# Patient Record
Sex: Female | Born: 1985 | Race: White | Hispanic: No | Marital: Married | Smoking: Never smoker
Health system: Southern US, Community
[De-identification: ages and names within clinical notes are randomized; demographics above are authoritative.]

## PROBLEM LIST (undated history)

## (undated) DIAGNOSIS — G43909 Migraine, unspecified, not intractable, without status migrainosus: Secondary | ICD-10-CM

## (undated) DIAGNOSIS — I471 Supraventricular tachycardia, unspecified: Secondary | ICD-10-CM

## (undated) DIAGNOSIS — N200 Calculus of kidney: Secondary | ICD-10-CM

## (undated) DIAGNOSIS — M5020 Other cervical disc displacement, unspecified cervical region: Secondary | ICD-10-CM

## (undated) DIAGNOSIS — D134 Benign neoplasm of liver: Secondary | ICD-10-CM

## (undated) DIAGNOSIS — Z87442 Personal history of urinary calculi: Secondary | ICD-10-CM

## (undated) DIAGNOSIS — M5030 Other cervical disc degeneration, unspecified cervical region: Secondary | ICD-10-CM

## (undated) HISTORY — DX: Other cervical disc degeneration, unspecified cervical region: M50.30

## (undated) HISTORY — PX: OTHER SURGICAL HISTORY: SHX169

## (undated) HISTORY — DX: Calculus of kidney: N20.0

## (undated) HISTORY — DX: Other cervical disc displacement, unspecified cervical region: M50.20

## (undated) HISTORY — PX: ADENOIDECTOMY: SUR15

---
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
## 2003-11-16 ENCOUNTER — Other Ambulatory Visit: Admission: RE | Admit: 2003-11-16 | Discharge: 2003-11-16 | Payer: Self-pay | Admitting: Obstetrics and Gynecology

## 2005-11-08 ENCOUNTER — Encounter: Admission: RE | Admit: 2005-11-08 | Discharge: 2005-11-08 | Payer: Self-pay | Admitting: Orthopedic Surgery

## 2005-11-09 HISTORY — PX: FOOT SURGERY: SHX648

## 2007-03-10 ENCOUNTER — Encounter: Admission: RE | Admit: 2007-03-10 | Discharge: 2007-03-10 | Payer: Self-pay | Admitting: Neurology

## 2010-11-09 DIAGNOSIS — I471 Supraventricular tachycardia, unspecified: Secondary | ICD-10-CM

## 2010-11-09 HISTORY — DX: Supraventricular tachycardia, unspecified: I47.10

## 2010-11-21 ENCOUNTER — Ambulatory Visit (HOSPITAL_COMMUNITY)
Admission: RE | Admit: 2010-11-21 | Discharge: 2010-11-21 | Payer: Self-pay | Source: Home / Self Care | Attending: Internal Medicine | Admitting: Internal Medicine

## 2011-04-29 ENCOUNTER — Other Ambulatory Visit (HOSPITAL_COMMUNITY): Payer: Self-pay | Admitting: Internal Medicine

## 2011-04-29 DIAGNOSIS — R109 Unspecified abdominal pain: Secondary | ICD-10-CM

## 2011-04-30 ENCOUNTER — Other Ambulatory Visit (HOSPITAL_COMMUNITY): Payer: Self-pay

## 2011-05-01 ENCOUNTER — Other Ambulatory Visit (HOSPITAL_COMMUNITY): Payer: Self-pay

## 2011-05-05 ENCOUNTER — Other Ambulatory Visit (HOSPITAL_COMMUNITY): Payer: Self-pay

## 2011-05-18 ENCOUNTER — Other Ambulatory Visit (HOSPITAL_COMMUNITY): Payer: Self-pay

## 2012-02-29 DIAGNOSIS — L259 Unspecified contact dermatitis, unspecified cause: Secondary | ICD-10-CM | POA: Insufficient documentation

## 2012-03-21 DIAGNOSIS — Z0000 Encounter for general adult medical examination without abnormal findings: Secondary | ICD-10-CM | POA: Insufficient documentation

## 2012-05-10 DIAGNOSIS — R519 Headache, unspecified: Secondary | ICD-10-CM | POA: Insufficient documentation

## 2012-05-10 DIAGNOSIS — IMO0001 Reserved for inherently not codable concepts without codable children: Secondary | ICD-10-CM | POA: Insufficient documentation

## 2012-05-10 DIAGNOSIS — R51 Headache: Secondary | ICD-10-CM

## 2012-11-10 ENCOUNTER — Telehealth: Payer: Self-pay

## 2012-11-10 NOTE — Telephone Encounter (Signed)
PT STATES SHE HAD A TETANUS SHOT IN 08 AND NEED TO COME BY AND P/U A COPY. WOULD LIKE TO HAVE BY TOMORROW IF POSSIBLE. PLEASE CALL [phone]

## 2012-11-10 NOTE — Telephone Encounter (Signed)
Abstracted, printed placed at front desk. Called patient to advise.

## 2015-11-10 NOTE — L&D Delivery Note (Addendum)
Pt complete and at +2 station with epidural controlling pain. Pt pushed for about 30 mins to deliver a viable female infant in OA position over first degree perineal laceration. Face was noted to be flush with perineum on delivery of head. Bed was flattened and McRoberts employed.  Anterior shoulder was then delivered with third set of pushes after posterior shoulder was rotated anteriorly. Next push after that was effective in also delivering posterior shoulder; body easily followed next. Umbilical cord was noted to be around her foot x 1.  Infant was placed on mothers abdomen and effectively stimulated eliciting a vigorous cry. Cord was then clamped and cut. Cord blood was obtained.  Placenta then delivered about 5 mins later intact, 3VC shultz. Fundal massage performed and pitocin per protocol. Fundus firm. Vaginal inspection confirmed first degree perineal lac with a periurethral abrasion as well. These were repaired with 2-0 vicryl suture.Mother and baby stable. Counts correct. Apgars of 7 and 9

## 2015-11-20 DIAGNOSIS — Z36 Encounter for antenatal screening of mother: Secondary | ICD-10-CM | POA: Diagnosis not present

## 2015-11-20 DIAGNOSIS — Z3A27 27 weeks gestation of pregnancy: Secondary | ICD-10-CM | POA: Diagnosis not present

## 2015-11-20 DIAGNOSIS — Z23 Encounter for immunization: Secondary | ICD-10-CM | POA: Diagnosis not present

## 2015-11-25 DIAGNOSIS — O99810 Abnormal glucose complicating pregnancy: Secondary | ICD-10-CM | POA: Diagnosis not present

## 2015-12-31 DIAGNOSIS — O368130 Decreased fetal movements, third trimester, not applicable or unspecified: Secondary | ICD-10-CM | POA: Diagnosis not present

## 2015-12-31 DIAGNOSIS — Z3A33 33 weeks gestation of pregnancy: Secondary | ICD-10-CM | POA: Diagnosis not present

## 2016-01-03 DIAGNOSIS — O368130 Decreased fetal movements, third trimester, not applicable or unspecified: Secondary | ICD-10-CM | POA: Diagnosis not present

## 2016-01-03 DIAGNOSIS — Z3A33 33 weeks gestation of pregnancy: Secondary | ICD-10-CM | POA: Diagnosis not present

## 2016-01-03 LAB — OB RESULTS CONSOLE ANTIBODY SCREEN: ANTIBODY SCREEN: NEGATIVE

## 2016-01-03 LAB — OB RESULTS CONSOLE GC/CHLAMYDIA
Chlamydia: NEGATIVE
GC PROBE AMP, GENITAL: NEGATIVE

## 2016-01-03 LAB — OB RESULTS CONSOLE RPR: RPR: NONREACTIVE

## 2016-01-03 LAB — OB RESULTS CONSOLE HEPATITIS B SURFACE ANTIGEN: Hepatitis B Surface Ag: NEGATIVE

## 2016-01-03 LAB — OB RESULTS CONSOLE RUBELLA ANTIBODY, IGM: Rubella: IMMUNE

## 2016-01-03 LAB — OB RESULTS CONSOLE ABO/RH: RH TYPE: POSITIVE

## 2016-01-03 LAB — OB RESULTS CONSOLE HIV ANTIBODY (ROUTINE TESTING): HIV: NONREACTIVE

## 2016-01-13 DIAGNOSIS — Z7689 Persons encountering health services in other specified circumstances: Secondary | ICD-10-CM | POA: Diagnosis not present

## 2016-01-13 DIAGNOSIS — F411 Generalized anxiety disorder: Secondary | ICD-10-CM | POA: Diagnosis not present

## 2016-01-15 DIAGNOSIS — Z3403 Encounter for supervision of normal first pregnancy, third trimester: Secondary | ICD-10-CM | POA: Diagnosis not present

## 2016-01-15 DIAGNOSIS — Z36 Encounter for antenatal screening of mother: Secondary | ICD-10-CM | POA: Diagnosis not present

## 2016-01-15 DIAGNOSIS — Z3A35 35 weeks gestation of pregnancy: Secondary | ICD-10-CM | POA: Diagnosis not present

## 2016-01-17 LAB — OB RESULTS CONSOLE GBS: STREP GROUP B AG: POSITIVE

## 2016-01-21 DIAGNOSIS — Z3403 Encounter for supervision of normal first pregnancy, third trimester: Secondary | ICD-10-CM | POA: Diagnosis not present

## 2016-01-21 DIAGNOSIS — Z3A36 36 weeks gestation of pregnancy: Secondary | ICD-10-CM | POA: Diagnosis not present

## 2016-01-28 DIAGNOSIS — Z3A37 37 weeks gestation of pregnancy: Secondary | ICD-10-CM | POA: Diagnosis not present

## 2016-01-28 DIAGNOSIS — Z3403 Encounter for supervision of normal first pregnancy, third trimester: Secondary | ICD-10-CM | POA: Diagnosis not present

## 2016-02-03 DIAGNOSIS — Z3403 Encounter for supervision of normal first pregnancy, third trimester: Secondary | ICD-10-CM | POA: Diagnosis not present

## 2016-02-03 DIAGNOSIS — Z3A37 37 weeks gestation of pregnancy: Secondary | ICD-10-CM | POA: Diagnosis not present

## 2016-02-11 DIAGNOSIS — Z3A39 39 weeks gestation of pregnancy: Secondary | ICD-10-CM | POA: Diagnosis not present

## 2016-02-11 DIAGNOSIS — Z3403 Encounter for supervision of normal first pregnancy, third trimester: Secondary | ICD-10-CM | POA: Diagnosis not present

## 2016-02-14 DIAGNOSIS — Z3403 Encounter for supervision of normal first pregnancy, third trimester: Secondary | ICD-10-CM | POA: Diagnosis not present

## 2016-02-14 DIAGNOSIS — O480 Post-term pregnancy: Secondary | ICD-10-CM | POA: Diagnosis not present

## 2016-02-14 DIAGNOSIS — Z3A39 39 weeks gestation of pregnancy: Secondary | ICD-10-CM | POA: Diagnosis not present

## 2016-02-15 ENCOUNTER — Encounter (HOSPITAL_COMMUNITY): Payer: Self-pay

## 2016-02-15 ENCOUNTER — Inpatient Hospital Stay (HOSPITAL_COMMUNITY)
Admission: AD | Admit: 2016-02-15 | Discharge: 2016-02-18 | DRG: 775 | Disposition: A | Discharge status: Home / Self Care | Payer: 59 | Source: Ambulatory Visit | Attending: Obstetrics and Gynecology | Admitting: Obstetrics and Gynecology

## 2016-02-15 ENCOUNTER — Inpatient Hospital Stay (HOSPITAL_COMMUNITY)
Admission: AD | Admit: 2016-02-15 | Discharge: 2016-02-15 | Disposition: A | Discharge status: Home / Self Care | Payer: 59 | Source: Ambulatory Visit | Attending: Obstetrics and Gynecology | Admitting: Obstetrics and Gynecology

## 2016-02-15 DIAGNOSIS — O24420 Gestational diabetes mellitus in childbirth, diet controlled: Secondary | ICD-10-CM | POA: Diagnosis not present

## 2016-02-15 DIAGNOSIS — Z3A39 39 weeks gestation of pregnancy: Secondary | ICD-10-CM

## 2016-02-15 DIAGNOSIS — O26893 Other specified pregnancy related conditions, third trimester: Secondary | ICD-10-CM

## 2016-02-15 DIAGNOSIS — K219 Gastro-esophageal reflux disease without esophagitis: Secondary | ICD-10-CM | POA: Diagnosis present

## 2016-02-15 DIAGNOSIS — O99824 Streptococcus B carrier state complicating childbirth: Secondary | ICD-10-CM | POA: Diagnosis not present

## 2016-02-15 DIAGNOSIS — O9962 Diseases of the digestive system complicating childbirth: Secondary | ICD-10-CM | POA: Diagnosis present

## 2016-02-15 DIAGNOSIS — Z8249 Family history of ischemic heart disease and other diseases of the circulatory system: Secondary | ICD-10-CM

## 2016-02-15 DIAGNOSIS — IMO0001 Reserved for inherently not codable concepts without codable children: Secondary | ICD-10-CM

## 2016-02-15 HISTORY — DX: Supraventricular tachycardia: I47.1

## 2016-02-15 HISTORY — DX: Supraventricular tachycardia, unspecified: I47.10

## 2016-02-15 NOTE — MAU Note (Signed)
Pt complaints of contractions increasing in pain and frequency overnight. Pt denies bleeding and leaking of fluid. Pt states baby is moving normally.

## 2016-02-15 NOTE — MAU Note (Signed)
Contractions since yesterday. Stronger and closer tonight. Denies LOF or bleeding

## 2016-02-16 ENCOUNTER — Inpatient Hospital Stay (HOSPITAL_COMMUNITY): Payer: 59 | Admitting: Anesthesiology

## 2016-02-16 ENCOUNTER — Encounter (HOSPITAL_COMMUNITY): Payer: Self-pay | Admitting: *Deleted

## 2016-02-16 DIAGNOSIS — O9962 Diseases of the digestive system complicating childbirth: Secondary | ICD-10-CM | POA: Diagnosis present

## 2016-02-16 DIAGNOSIS — Z8249 Family history of ischemic heart disease and other diseases of the circulatory system: Secondary | ICD-10-CM | POA: Diagnosis not present

## 2016-02-16 DIAGNOSIS — K219 Gastro-esophageal reflux disease without esophagitis: Secondary | ICD-10-CM | POA: Diagnosis present

## 2016-02-16 DIAGNOSIS — Z3A39 39 weeks gestation of pregnancy: Secondary | ICD-10-CM | POA: Diagnosis not present

## 2016-02-16 DIAGNOSIS — IMO0001 Reserved for inherently not codable concepts without codable children: Secondary | ICD-10-CM

## 2016-02-16 DIAGNOSIS — O24420 Gestational diabetes mellitus in childbirth, diet controlled: Secondary | ICD-10-CM | POA: Diagnosis present

## 2016-02-16 DIAGNOSIS — O99824 Streptococcus B carrier state complicating childbirth: Secondary | ICD-10-CM | POA: Diagnosis present

## 2016-02-16 LAB — TYPE AND SCREEN
ABO/RH(D): A POS
Antibody Screen: NEGATIVE

## 2016-02-16 LAB — CBC
HCT: 38.1 % (ref 36.0–46.0)
Hemoglobin: 13.5 g/dL (ref 12.0–15.0)
MCH: 32.5 pg (ref 26.0–34.0)
MCHC: 35.4 g/dL (ref 30.0–36.0)
MCV: 91.8 fL (ref 78.0–100.0)
Platelets: 224 10*3/uL (ref 150–400)
RBC: 4.15 MIL/uL (ref 3.87–5.11)
RDW: 13.1 % (ref 11.5–15.5)
WBC: 16.5 10*3/uL — ABNORMAL HIGH (ref 4.0–10.5)

## 2016-02-16 LAB — ABO/RH: ABO/RH(D): A POS

## 2016-02-16 LAB — RPR: RPR: NONREACTIVE

## 2016-02-16 MED ORDER — EPHEDRINE 5 MG/ML INJ
10.0000 mg | INTRAVENOUS | Status: DC | PRN
  Filled 2016-02-16: qty 2

## 2016-02-16 MED ORDER — ACETAMINOPHEN 325 MG PO TABS
650.0000 mg | ORAL_TABLET | ORAL | Status: DC | PRN
  Administered 2016-02-16: 650 mg via ORAL
  Filled 2016-02-16: qty 2

## 2016-02-16 MED ORDER — BENZOCAINE-MENTHOL 20-0.5 % EX AERO
1.0000 "application " | INHALATION_SPRAY | CUTANEOUS | Status: DC | PRN
  Administered 2016-02-16: 1 via TOPICAL
  Filled 2016-02-16: qty 56

## 2016-02-16 MED ORDER — PENICILLIN G POTASSIUM 5000000 UNITS IJ SOLR
2.5000 10*6.[IU] | INTRAVENOUS | Status: DC
  Administered 2016-02-16 (×2): 2.5 10*6.[IU] via INTRAVENOUS
  Filled 2016-02-16 (×6): qty 2.5

## 2016-02-16 MED ORDER — LIDOCAINE HCL (PF) 1 % IJ SOLN
30.0000 mL | INTRAMUSCULAR | Status: DC | PRN
  Filled 2016-02-16: qty 30

## 2016-02-16 MED ORDER — PHENYLEPHRINE 40 MCG/ML (10ML) SYRINGE FOR IV PUSH (FOR BLOOD PRESSURE SUPPORT)
80.0000 ug | PREFILLED_SYRINGE | INTRAVENOUS | Status: DC | PRN
  Filled 2016-02-16: qty 2
  Filled 2016-02-16: qty 20

## 2016-02-16 MED ORDER — LACTATED RINGERS IV SOLN
INTRAVENOUS | Status: DC
  Administered 2016-02-16 (×2): via INTRAVENOUS

## 2016-02-16 MED ORDER — LACTATED RINGERS IV SOLN: INTRAVENOUS | Status: DC

## 2016-02-16 MED ORDER — OXYCODONE-ACETAMINOPHEN 5-325 MG PO TABS: 2.0000 | ORAL_TABLET | ORAL | Status: DC | PRN

## 2016-02-16 MED ORDER — FENTANYL 2.5 MCG/ML BUPIVACAINE 1/10 % EPIDURAL INFUSION (WH - ANES)
14.0000 mL/h | INTRAMUSCULAR | Status: DC | PRN
  Administered 2016-02-16 (×3): 14 mL/h via EPIDURAL
  Filled 2016-02-16 (×3): qty 125

## 2016-02-16 MED ORDER — ACETAMINOPHEN 325 MG PO TABS
650.0000 mg | ORAL_TABLET | Freq: Once | ORAL | Status: AC
  Administered 2016-02-16: 650 mg via ORAL
  Filled 2016-02-16: qty 2

## 2016-02-16 MED ORDER — LANOLIN HYDROUS EX OINT: TOPICAL_OINTMENT | CUTANEOUS | Status: DC | PRN

## 2016-02-16 MED ORDER — DIPHENHYDRAMINE HCL 50 MG/ML IJ SOLN: 12.5000 mg | INTRAMUSCULAR | Status: DC | PRN

## 2016-02-16 MED ORDER — IBUPROFEN 600 MG PO TABS
600.0000 mg | ORAL_TABLET | Freq: Four times a day (QID) | ORAL | Status: DC
  Administered 2016-02-16 – 2016-02-18 (×7): 600 mg via ORAL
  Filled 2016-02-16 (×7): qty 1

## 2016-02-16 MED ORDER — ONDANSETRON HCL 4 MG PO TABS: 4.0000 mg | ORAL_TABLET | ORAL | Status: DC | PRN

## 2016-02-16 MED ORDER — PHENYLEPHRINE 40 MCG/ML (10ML) SYRINGE FOR IV PUSH (FOR BLOOD PRESSURE SUPPORT)
80.0000 ug | PREFILLED_SYRINGE | INTRAVENOUS | Status: DC | PRN
  Filled 2016-02-16: qty 2

## 2016-02-16 MED ORDER — PRENATAL MULTIVITAMIN CH
1.0000 | ORAL_TABLET | Freq: Every day | ORAL | Status: DC
  Administered 2016-02-17 – 2016-02-18 (×2): 1 via ORAL
  Filled 2016-02-16 (×2): qty 1

## 2016-02-16 MED ORDER — SIMETHICONE 80 MG PO CHEW: 80.0000 mg | CHEWABLE_TABLET | ORAL | Status: DC | PRN

## 2016-02-16 MED ORDER — WITCH HAZEL-GLYCERIN EX PADS: 1.0000 "application " | MEDICATED_PAD | CUTANEOUS | Status: DC | PRN

## 2016-02-16 MED ORDER — OXYTOCIN BOLUS FROM INFUSION: 500.0000 mL | INTRAVENOUS | Status: DC

## 2016-02-16 MED ORDER — OXYTOCIN 10 UNIT/ML IJ SOLN
1.0000 m[IU]/min | INTRAVENOUS | Status: DC
  Administered 2016-02-16: 2 m[IU]/min via INTRAVENOUS

## 2016-02-16 MED ORDER — DIPHENHYDRAMINE HCL 25 MG PO CAPS: 25.0000 mg | ORAL_CAPSULE | Freq: Four times a day (QID) | ORAL | Status: DC | PRN

## 2016-02-16 MED ORDER — LACTATED RINGERS IV SOLN
500.0000 mL | Freq: Once | INTRAVENOUS | Status: AC
  Administered 2016-02-16: 1000 mL via INTRAVENOUS

## 2016-02-16 MED ORDER — SENNOSIDES-DOCUSATE SODIUM 8.6-50 MG PO TABS
2.0000 | ORAL_TABLET | ORAL | Status: DC
  Administered 2016-02-16 – 2016-02-18 (×2): 2 via ORAL
  Filled 2016-02-16 (×2): qty 2

## 2016-02-16 MED ORDER — TETANUS-DIPHTH-ACELL PERTUSSIS 5-2.5-18.5 LF-MCG/0.5 IM SUSP: 0.5000 mL | Freq: Once | INTRAMUSCULAR | Status: DC

## 2016-02-16 MED ORDER — ACETAMINOPHEN 325 MG PO TABS
650.0000 mg | ORAL_TABLET | ORAL | Status: DC | PRN
  Administered 2016-02-17 (×2): 650 mg via ORAL
  Filled 2016-02-16 (×2): qty 2

## 2016-02-16 MED ORDER — OXYCODONE-ACETAMINOPHEN 5-325 MG PO TABS: 1.0000 | ORAL_TABLET | ORAL | Status: DC | PRN

## 2016-02-16 MED ORDER — LACTATED RINGERS IV SOLN
2.5000 [IU]/h | INTRAVENOUS | Status: DC
  Filled 2016-02-16: qty 4

## 2016-02-16 MED ORDER — BUTORPHANOL TARTRATE 1 MG/ML IJ SOLN
INTRAMUSCULAR | Status: AC
  Filled 2016-02-16: qty 2

## 2016-02-16 MED ORDER — LACTATED RINGERS IV SOLN
500.0000 mL | Freq: Once | INTRAVENOUS | Status: DC
Start: 2016-02-16 — End: 2016-02-16

## 2016-02-16 MED ORDER — BUTORPHANOL TARTRATE 2 MG/ML IJ SOLN: 2.0000 mg | Freq: Once | INTRAMUSCULAR | Status: DC

## 2016-02-16 MED ORDER — TERBUTALINE SULFATE 1 MG/ML IJ SOLN
0.2500 mg | Freq: Once | INTRAMUSCULAR | Status: DC | PRN
  Filled 2016-02-16: qty 1

## 2016-02-16 MED ORDER — PENICILLIN G POTASSIUM 5000000 UNITS IJ SOLR
5.0000 10*6.[IU] | Freq: Once | INTRAVENOUS | Status: AC
  Administered 2016-02-16: 5 10*6.[IU] via INTRAVENOUS
  Filled 2016-02-16: qty 5

## 2016-02-16 MED ORDER — ONDANSETRON HCL 4 MG/2ML IJ SOLN
4.0000 mg | Freq: Four times a day (QID) | INTRAMUSCULAR | Status: DC | PRN
  Administered 2016-02-16 (×2): 4 mg via INTRAVENOUS
  Filled 2016-02-16 (×2): qty 2

## 2016-02-16 MED ORDER — ZOLPIDEM TARTRATE 5 MG PO TABS: 5.0000 mg | ORAL_TABLET | Freq: Every evening | ORAL | Status: DC | PRN

## 2016-02-16 MED ORDER — DIBUCAINE 1 % RE OINT: 1.0000 "application " | TOPICAL_OINTMENT | RECTAL | Status: DC | PRN

## 2016-02-16 MED ORDER — CITRIC ACID-SODIUM CITRATE 334-500 MG/5ML PO SOLN
30.0000 mL | ORAL | Status: DC | PRN
  Administered 2016-02-16: 30 mL via ORAL
  Filled 2016-02-16: qty 15

## 2016-02-16 MED ORDER — FLEET ENEMA 7-19 GM/118ML RE ENEM: 1.0000 | ENEMA | RECTAL | Status: DC | PRN

## 2016-02-16 MED ORDER — LIDOCAINE HCL (PF) 1 % IJ SOLN
INTRAMUSCULAR | Status: DC | PRN
  Administered 2016-02-16: 5 mL
  Administered 2016-02-16: 3 mL
  Administered 2016-02-16: 2 mL via EPIDURAL

## 2016-02-16 MED ORDER — ONDANSETRON HCL 4 MG/2ML IJ SOLN: 4.0000 mg | INTRAMUSCULAR | Status: DC | PRN

## 2016-02-16 MED ORDER — LACTATED RINGERS IV SOLN: 500.0000 mL | INTRAVENOUS | Status: DC | PRN

## 2016-02-16 NOTE — Anesthesia Procedure Notes (Signed)
Epidural Patient location during procedure: OB  Staffing Anesthesiologist: Suzette Battiest Performed by: anesthesiologist   Preanesthetic Checklist Completed: patient identified, site marked, surgical consent, pre-op evaluation, timeout performed, IV checked, risks and benefits discussed and monitors and equipment checked  Epidural Patient position: sitting Prep: site prepped and draped and DuraPrep Patient monitoring: continuous pulse ox and blood pressure Approach: midline Location: L3-L4 Injection technique: LOR saline  Needle:  Needle type: Tuohy  Needle gauge: 17 G Needle length: 9 cm and 9 Needle insertion depth: 5 cm cm Catheter type: closed end flexible Catheter size: 19 Gauge Catheter at skin depth: 11 (10cm at skin initially. Advanced to 11cm when pt laid in right lat decubitus position.) cm Test dose: negative  Assessment Events: blood not aspirated, injection not painful, no injection resistance, negative IV test and no paresthesia

## 2016-02-16 NOTE — Progress Notes (Signed)
Patient ID: Lindsay Parsons, female   DOB: [birth year], 30 y.o.   MRN: [id number] Pt doing well. Comfortable with epidural. Not appreciating pressure or contractions. +Fms VSS  SVE 9.5/100/+1  A/P: progressing well in labor on pitocin         Recheck in an hour and will start pushing if complete          Anticipate svd

## 2016-02-16 NOTE — Progress Notes (Signed)
Patient ID: Lindsay Parsons, female   DOB: 1986-02-20, 30 y.o.   MRN: [id number] Pt comfortable with epidural. Has no complaints. +Fms VSS EFM- 150s, +accels, - decels, moderate variability, cat 1 TOCO-contractions q 81mins SVE- 6/90/-1  A/P: G1P0 at 39+ weeks GDMA1, labor         Will begin pitocin augmentation         GBS prophylaxis ongoing          Anticipate svd

## 2016-02-16 NOTE — Anesthesia Preprocedure Evaluation (Addendum)
Anesthesia Evaluation  Patient identified by MRN, date of birth, ID band Patient awake    Reviewed: Allergy & Precautions, Patient's Chart, lab work & pertinent test results  Airway Mallampati: II       Dental   Pulmonary neg pulmonary ROS,    Pulmonary exam normal        Cardiovascular negative cardio ROS Normal cardiovascular exam  Hx of SVT. Asymptomatic currently   Neuro/Psych negative neurological ROS     GI/Hepatic Neg liver ROS, GERD  ,  Endo/Other  negative endocrine ROS  Renal/GU negative Renal ROS     Musculoskeletal   Abdominal   Peds  Hematology negative hematology ROS (+)   Anesthesia Other Findings   Reproductive/Obstetrics (+) Pregnancy 30yo G1 @ 39.5WGA in labor                            Lab Results  Component Value Date   WBC 16.5* 02/16/2016   HGB 13.5 02/16/2016   HCT 38.1 02/16/2016   MCV 91.8 02/16/2016   PLT 224 02/16/2016   No results found for: CREATININE, BUN, NA, K, CL, CO2  Anesthesia Physical Anesthesia Plan  ASA: II  Anesthesia Plan: Epidural   Post-op Pain Management:    Induction:   Airway Management Planned: Natural Airway  Additional Equipment:   Intra-op Plan:   Post-operative Plan:   Informed Consent: I have reviewed the patients History and Physical, chart, labs and discussed the procedure including the risks, benefits and alternatives for the proposed anesthesia with the patient or authorized representative who has indicated his/her understanding and acceptance.     Plan Discussed with:   Anesthesia Plan Comments:         Anesthesia Quick Evaluation

## 2016-02-16 NOTE — H&P (Signed)
Lindsay Parsons is a 30 y.o. female presenting for painful regular contractions. Pt has been having regular contractions for the past 2-3days but no cervical change had been made. On arrival at MAU pt was confirmed to have changed to 3cm; intact membranes.  Pt had a prenatal course that was complicated only with diet controlled GDM. Her dating is based on lmp and confirmed with first trimester [place] at [facility].   Maternal Medical History:  Reason for admission: Contractions and nausea.   Contractions: Onset was 2 days ago.   Frequency: regular.   Perceived severity is moderate.    Fetal activity: Perceived fetal activity is normal.   Last perceived fetal movement was within the past hour.    Prenatal complications: no prenatal complications Prenatal Complications - Diabetes: gestational. Diabetes is managed by diet.      OB History    Gravida Para Term Preterm AB TAB SAB Ectopic Multiple Living   1         0     Past Medical History  Diagnosis Date  . SVT (supraventricular tachycardia) [facility])    Past Surgical History  Procedure Laterality Date  . Foot surgery  2007    cyst removed from left foot  . Tonsillectomy  1989   Family History: family history includes Cancer in her father, maternal grandfather, maternal grandmother, and paternal grandfather; Hyperlipidemia in her father; Hypertension in her paternal grandfather. Social History:  reports that she has never smoked. She has never used smokeless tobacco. She reports that she does not drink alcohol or use illicit drugs.   Prenatal Transfer Tool  Maternal Diabetes: Yes:  Diabetes Type:  Diet controlled Genetic Screening: Normal Maternal Ultrasounds/Referrals: Normal Fetal Ultrasounds or other Referrals:  None Maternal Substance Abuse:  No Significant Maternal Medications:  None Significant Maternal Lab Results:  Lab values include: Group B Strep positive Other Comments:  None  Review of Systems  Constitutional: Negative  for fever, chills, weight loss and malaise/fatigue.  Eyes: Negative for blurred vision.  Respiratory: Negative for shortness of breath.   Cardiovascular: Negative for chest pain.  Gastrointestinal: Positive for nausea. Negative for heartburn and vomiting.  Genitourinary: Negative for dysuria.  Musculoskeletal: Negative for back pain.  Neurological: Negative for dizziness and headaches.  Psychiatric/Behavioral: Negative for depression. The patient is not nervous/anxious.     Dilation: 5 Effacement (%): 90 Station: -1 Exam by:: [email or web address] Blood pressure 115/69, pulse 96, temperature 98.7 F (37.1 C), temperature source Oral, resp. rate 16, height 5\' 3"  (1.6 m), weight 196 lb (88.905 kg), SpO2 99 %. Maternal Exam:  Uterine Assessment: Contraction strength is moderate.  Contraction frequency is regular.   Abdomen: Patient reports generalized tenderness.  Estimated fetal weight is AGA.   Fetal presentation: vertex  Introitus: Normal vulva. Normal vagina.  Pelvis: adequate for delivery.   Cervix: Cervix evaluated by digital exam.     Physical Exam  Constitutional: She is oriented to person, place, and time. She appears well-developed and well-nourished.  Neck: Normal range of motion.  Cardiovascular: Normal rate.   Respiratory: Effort normal.  GI: Soft. There is generalized tenderness.  Genitourinary: Vagina normal and uterus normal.  Musculoskeletal: Normal range of motion.  Neurological: She is alert and oriented to person, place, and time.  Skin: Skin is warm.  Psychiatric: She has a normal mood and affect. Her behavior is normal. Judgment and thought content normal.    Prenatal labs: ABO, Rh: --/--/A POS, A POS (04/09 0230) Antibody: NEG (04/09  0230) Rubella: Immune (02/24 0000) RPR: Nonreactive (02/24 0000)  HBsAg: Negative (02/24 0000)  HIV: Non-reactive (02/24 0000)  GBS: Positive (03/10 0000)   Assessment/Plan: G1P0 at 75 5/[redacted]wks gestation in active labor  SVE now  5/80/0; appears ruptured Comfortable with epidural GBS prophylaxis begun If indicated will augment with pitocin Anticipate svd   Bonnee Quin Banga 02/16/2016, 7:29 AM

## 2016-02-17 LAB — CBC
HEMATOCRIT: 29.7 % — AB (ref 36.0–46.0)
HEMOGLOBIN: 10.4 g/dL — AB (ref 12.0–15.0)
MCH: 32.3 pg (ref 26.0–34.0)
MCHC: 35 g/dL (ref 30.0–36.0)
MCV: 92.2 fL (ref 78.0–100.0)
Platelets: 180 10*3/uL (ref 150–400)
RBC: 3.22 MIL/uL — AB (ref 3.87–5.11)
RDW: 13.1 % (ref 11.5–15.5)
WBC: 17.1 10*3/uL — AB (ref 4.0–10.5)

## 2016-02-17 NOTE — Progress Notes (Signed)
Post Partum Day 1 Subjective: up ad lib, voiding, tolerating PO, + flatus and breastfeeding well. Tired due to cluster feeding overnight Also reports back pain and feeling sore in vaginal area. Lochia mild. No HA or CP or SOB  Objective: Blood pressure 99/53, pulse 78, temperature 98 F (36.7 C), temperature source Oral, resp. rate 18, height 5\' 3"  (1.6 m), weight 196 lb (88.905 kg), SpO2 98 %, unknown if currently breastfeeding.  Physical Exam:  General: alert, cooperative, fatigued and no distress Lochia: appropriate Uterine Fundus: firm DVT Evaluation: No evidence of DVT seen on physical exam.   Recent Labs  02/16/16 0230 02/17/16 0520  HGB 13.5 10.4*  HCT 38.1 29.7*    Assessment/Plan: Plan for discharge tomorrow and Breastfeeding   LOS: 1 day   [facility] 02/17/2016, 6:38 AM

## 2016-02-17 NOTE — Anesthesia Postprocedure Evaluation (Signed)
Anesthesia Post Note  Patient: Lindsay Parsons  Procedure(s) Performed: * No procedures listed *  Patient location during evaluation: Mother Baby Anesthesia Type: Epidural Level of consciousness: awake and alert and oriented Pain management: satisfactory to patient Vital Signs Assessment: post-procedure vital signs reviewed and stable Respiratory status: spontaneous breathing and nonlabored ventilation Cardiovascular status: stable Postop Assessment: no headache, no backache, no signs of nausea or vomiting, adequate PO intake and patient able to bend at knees (patient up walking) Anesthetic complications: no    Last Vitals:  Filed Vitals:   02/17/16 0514 02/17/16 0817  BP: 99/53 104/55  Pulse: 78 94  Temp: 36.7 C 36.9 C  Resp: 18 20    Last Pain:  Filed Vitals:   02/17/16 0820  PainSc: 3                  Letha Mirabal

## 2016-02-17 NOTE — Lactation Note (Signed)
This note was copied from a baby's chart. Lactation Consultation Note  Patient Name: Lindsay Parsons Today's Date: 02/17/2016 Reason for consult: Initial assessment   With this mom and term baby, now 1 hours old and full term. Mom is doing well with breast feeding, but wanted to make sure "I was doing it correct". I assisted mom with latching the baby in football hold, first to left breast. The baby had just been assessed by nurse, was crying, and would not maintain latch. Mom does a flat, semi everted nipple on this breast, so we then tried right breast. The baby latched easily, stayed latched deeply, with strong suckles and lots off visible swallows. Mom has lots of easily expressed colostrum Basic teaching from the baby and Me book done, and  Lactation services also reviewed.  I brought mom a 20 nipple shield to try on her left breast, the next time she feeds, to see if this helps keep baby latched. Mom knows to call for questions/concerns.    Maternal Data Formula Feeding for Exclusion: No Has patient been taught Hand Expression?: Yes Does the patient have breastfeeding experience prior to this delivery?: No  Feeding Feeding Type: Breast Fed  LATCH Score/Interventions Latch: Repeated attempts needed to sustain latch, nipple held in mouth throughout feeding, stimulation needed to elicit sucking reflex. Intervention(s): Adjust position;Assist with latch  Audible Swallowing: Spontaneous and intermittent Intervention(s): Skin to skin;Hand expression  Type of Nipple: Flat (semi flat)  Comfort (Breast/Nipple): Soft / non-tender     Hold (Positioning): Assistance needed to correctly position infant at breast and maintain latch. Intervention(s): Breastfeeding basics reviewed;Support Pillows;Position options;Skin to skin  LATCH Score: 7  Lactation Tools Discussed/Used     Consult Status Consult Status: Follow-up Date: 02/18/16 Follow-up type: In-patient    Tonna Corner 02/17/2016, 10:57 AM

## 2016-02-17 NOTE — Lactation Note (Signed)
This note was copied from a baby's chart. Lactation Consultation Note  Patient Name: Lindsay Parsons [id number] Date: 02/17/2016 Reason for consult: Follow-up assessment    With this mom and term baby, now 81 hours old. I assisted mom with application of nipple shield, and then latched baby in football to left breast. She would latched deeply with strong suckles, but then cry and unlatch. I removed the shield, and mom was able to get the baby latched deepy, with god brest movement noted. I think she had everted mom's nipple enough  To make latching easier, so I broulght mom a manual hand pump, and advised her to use prior to latching in left breast, as needed. i Instructed mom in the pump use and care. Mom will call for questions/concerns.    Maternal Data Formula Feeding for Exclusion: No Has patient been taught Hand Expression?: Yes Does the patient have breastfeeding experience prior to this delivery?: No  Feeding Feeding Type: Breast Fed Length of feed: 25 min  LATCH Score/Interventions Latch: Grasps breast easily, tongue down, lips flanged, rhythmical sucking. Intervention(s): Adjust position;Assist with latch  Audible Swallowing: A few with stimulation Intervention(s): Skin to skin;Hand expression  Type of Nipple: Flat (tried nipple shiled first, baby would not stay latached, but had everted mom's nipple, that baby latched easily and maintained latch well) Intervention(s): Hand pump  Comfort (Breast/Nipple): Soft / non-tender     Hold (Positioning): Assistance needed to correctly position infant at breast and maintain latch. Intervention(s): Breastfeeding basics reviewed;Support Pillows;Position options;Skin to skin  LATCH Score: 7  Lactation Tools Discussed/Used Tools: Nipple Shields Nipple shield size: 20   Consult Status Consult Status: Follow-up Date: 02/18/16 Follow-up type: In-patient    Tonna Corner 02/17/2016, 11:33 AM

## 2016-02-18 MED ORDER — IBUPROFEN 600 MG PO TABS: 600.0000 mg | ORAL_TABLET | Freq: Four times a day (QID) | ORAL | Status: DC

## 2016-02-18 NOTE — Discharge Summary (Signed)
OB Discharge Summary     Patient Name: Lindsay Parsons DOB: 05/27/1986 MRN: [id number]  Date of admission: 02/15/2016 Delivering MD: Carlynn Purl [facility]   Date of discharge: 02/18/2016  Admitting diagnosis: 39wks, CTX, Pressure Intrauterine pregnancy: [redacted]w[redacted]d     Secondary diagnosis:  Active Problems:   Active labor   SVD (spontaneous vaginal delivery)   Postpartum care following vaginal delivery  Additional problems: none     Discharge diagnosis: Term Pregnancy Delivered                                                                                                Post partum procedures:none  Augmentation: Pitocin  Complications: None  Hospital course:  Onset of Labor With Vaginal Delivery     30 y.o. yo G1P1001 at [redacted]w[redacted]d was admitted in Active Labor on 02/15/2016. Patient had an uncomplicated labor course as follows:  Membrane Rupture Time/Date: 6:00 AM ,02/16/2016   Intrapartum Procedures: Episiotomy: None [1]                                         Lacerations:  Periurethral [8];1st degree [2]  Patient had a delivery of a Viable infant. 02/16/2016  Information for the patient's newborn:  Chianne, Best [id number]  Delivery Method: Vaginal, Spontaneous Delivery (Filed from Delivery Summary)    Pateint had an uncomplicated postpartum course.  She is ambulating, tolerating a regular diet, passing flatus, and urinating well. Patient is discharged home in stable condition on 02/18/2016.    Physical exam  Filed Vitals:   02/17/16 0817 02/17/16 1246 02/17/16 1747 02/18/16 0506  BP: 104/55 113/72 108/64 115/71  Pulse: 94 87 84 79  Temp: 98.5 F (36.9 C) 97.5 F (36.4 C) 98.3 F (36.8 C) 97.8 F (36.6 C)  TempSrc: Oral Oral Oral Oral  Resp: 20 17 18 18   Height:      Weight:      SpO2:  99%     General: alert, cooperative and no distress Lochia: appropriate Uterine Fundus: firm Incision: N/A DVT Evaluation: No evidence of DVT seen on physical  exam. Negative Homan's sign. Labs: Lab Results  Component Value Date   WBC 17.1* 02/17/2016   HGB 10.4* 02/17/2016   HCT 29.7* 02/17/2016   MCV 92.2 02/17/2016   PLT 180 02/17/2016   No flowsheet data found.  Discharge instruction: per After Visit Summary and "Baby and Me Booklet".  After visit meds:    Medication List    TAKE these medications        acetaminophen 325 MG tablet  Commonly known as:  TYLENOL  Take 650 mg by mouth every 6 (six) hours as needed for mild pain or headache.     doxylamine (Sleep) 25 MG tablet  Commonly known as:  UNISOM  Take 25 mg by mouth at bedtime as needed for sleep.     ibuprofen 600 MG tablet  Commonly known as:  ADVIL,MOTRIN  Take 1 tablet (600 mg total) by mouth every  6 (six) hours.     prenatal multivitamin Tabs tablet  Take 1 tablet by mouth daily at 12 noon.     ranitidine 150 MG capsule  Commonly known as:  ZANTAC  Take 150 mg by mouth daily as needed for heartburn.        Diet: routine diet  Activity: Advance as tolerated. Pelvic rest for 6 weeks.   Outpatient follow up:6 weeks Follow up Appt:No future appointments. Follow up Visit:No Follow-up on file.  Postpartum contraception: Progesterone only pills  Newborn Data: Live born female  Birth Weight: 7 lb 10.2 oz (3464 g) APGAR: 7, 9  Baby Feeding: Breast Disposition:home with mother   02/18/2016 Sherlyn Hay, DO

## 2016-02-18 NOTE — Progress Notes (Signed)
Patient ID: Lindsay Parsons, female   DOB: June 07, 1986, 30 y.o.   MRN: [id number] Pt doing well. Pelvic and back pain well controlled. Lochia mild. Ambulating and tolerating po. +voids. Breastfeeding and bonding with baby. Desires d/c home today VSS ABD-soft, ND EXT- no homans  A/P: PPD#2 s/p svd         Instructions reviewed; d/c home today         F/u in 6weeks

## 2016-02-18 NOTE — Discharge Instructions (Signed)
Nothing in vagina for 6 weeks.  No sex, tampons, and douching.  Other instructions as in [facility] Discharge Booklet. °

## 2016-02-18 NOTE — Lactation Note (Signed)
This note was copied from a baby's chart. Lactation Consultation Note  Patient Name: Lindsay Parsons [id number] Date: 02/18/2016 Reason for consult: Follow-up assessment Baby 37 hours old. Mom nursing baby in football position on left breast. Mom reports that she has some bruising on left breast from the first attempts at nursing. Mom reports that she is a little sore from earlier attempts, but is having no pain now. Mom's left nipple slightly pinched when baby stopped nursing. Discussed positioning with mom in order to achieve a deeper latch on left breast. Mom reports that baby has no trouble achieving a deep latch on right breast. Enc mom to call for assistance as needed. Mom aware of OP/BFSG and [place] phone line assistance after D/C.   Maternal Data    Feeding Feeding Type: Breast Fed Length of feed: 20 min  LATCH Score/Interventions Latch: Grasps breast easily, tongue down, lips flanged, rhythmical sucking.  Audible Swallowing: Spontaneous and intermittent  Type of Nipple: Everted at rest and after stimulation (short shaft)  Comfort (Breast/Nipple): Filling, red/small blisters or bruises, mild/mod discomfort  Problem noted: Mild/Moderate discomfort  Hold (Positioning): No assistance needed to correctly position infant at breast. Intervention(s): Position options  LATCH Score: 9  Lactation Tools Discussed/Used     Consult Status Consult Status: PRN    Inocente Salles 02/18/2016, 11:51 AM

## 2016-02-20 ENCOUNTER — Telehealth (HOSPITAL_COMMUNITY): Payer: Self-pay | Admitting: Lactation Services

## 2016-02-20 NOTE — Telephone Encounter (Signed)
Mom called with concerns that baby was only feeding for short periods of time usually just 5 mins since midnight. Reports breasts are very full and baby seems like she is choking when breast feeding. Reviewed engorgement prevention and treatment. Suggested pumping prior to nursing to soften breast so baby will be able to latch better and milk flow will slow down. Reports 1 stool and 2 wet diapers since midnight and baby seems satisfies after nursing- goes off to sleep.Has DEBP and has used it once last night. Reviewed milk storage with mom. No further questions at present. To call back prn.

## 2016-03-25 DIAGNOSIS — Z3009 Encounter for other general counseling and advice on contraception: Secondary | ICD-10-CM | POA: Diagnosis not present

## 2016-03-25 DIAGNOSIS — Z1389 Encounter for screening for other disorder: Secondary | ICD-10-CM | POA: Diagnosis not present

## 2016-04-01 DIAGNOSIS — J028 Acute pharyngitis due to other specified organisms: Secondary | ICD-10-CM | POA: Diagnosis not present

## 2016-04-01 DIAGNOSIS — B9789 Other viral agents as the cause of diseases classified elsewhere: Secondary | ICD-10-CM | POA: Diagnosis not present

## 2016-06-09 DIAGNOSIS — R5383 Other fatigue: Secondary | ICD-10-CM | POA: Diagnosis not present

## 2016-06-09 DIAGNOSIS — R5381 Other malaise: Secondary | ICD-10-CM | POA: Diagnosis not present

## 2016-06-09 DIAGNOSIS — Z0000 Encounter for general adult medical examination without abnormal findings: Secondary | ICD-10-CM | POA: Diagnosis not present

## 2016-06-09 DIAGNOSIS — L858 Other specified epidermal thickening: Secondary | ICD-10-CM | POA: Diagnosis not present

## 2016-06-09 DIAGNOSIS — E538 Deficiency of other specified B group vitamins: Secondary | ICD-10-CM | POA: Diagnosis not present

## 2016-06-09 DIAGNOSIS — F5102 Adjustment insomnia: Secondary | ICD-10-CM | POA: Diagnosis not present

## 2016-06-17 DIAGNOSIS — E538 Deficiency of other specified B group vitamins: Secondary | ICD-10-CM | POA: Diagnosis not present

## 2016-06-24 DIAGNOSIS — E538 Deficiency of other specified B group vitamins: Secondary | ICD-10-CM | POA: Diagnosis not present

## 2016-07-03 DIAGNOSIS — E538 Deficiency of other specified B group vitamins: Secondary | ICD-10-CM | POA: Diagnosis not present

## 2016-07-06 DIAGNOSIS — E538 Deficiency of other specified B group vitamins: Secondary | ICD-10-CM | POA: Diagnosis not present

## 2016-07-20 DIAGNOSIS — Z6833 Body mass index (BMI) 33.0-33.9, adult: Secondary | ICD-10-CM | POA: Diagnosis not present

## 2016-07-20 DIAGNOSIS — Z01419 Encounter for gynecological examination (general) (routine) without abnormal findings: Secondary | ICD-10-CM | POA: Diagnosis not present

## 2016-07-27 DIAGNOSIS — E538 Deficiency of other specified B group vitamins: Secondary | ICD-10-CM | POA: Diagnosis not present

## 2016-08-04 DIAGNOSIS — E538 Deficiency of other specified B group vitamins: Secondary | ICD-10-CM | POA: Diagnosis not present

## 2016-09-01 DIAGNOSIS — R05 Cough: Secondary | ICD-10-CM | POA: Diagnosis not present

## 2016-09-04 DIAGNOSIS — E538 Deficiency of other specified B group vitamins: Secondary | ICD-10-CM | POA: Diagnosis not present

## 2016-09-14 ENCOUNTER — Ambulatory Visit: Payer: Self-pay | Admitting: Family

## 2016-09-14 ENCOUNTER — Encounter: Payer: Self-pay | Admitting: Physician Assistant

## 2016-09-14 VITALS — BP 138/80 | HR 80 | Temp 97.7°F

## 2016-09-14 DIAGNOSIS — J019 Acute sinusitis, unspecified: Secondary | ICD-10-CM

## 2016-09-14 MED ORDER — LEVOFLOXACIN 500 MG PO TABS: 500.0000 mg | ORAL_TABLET | Freq: Every day | ORAL | 0 refills | Status: DC

## 2016-09-14 NOTE — Progress Notes (Signed)
S/  3 d hx of nasal congestion, facial pain, blowing yellow , ST , malaise ; taking claritin, dayquil without relief;denies SOB, body aches or GI sxs  O/ VSS mildly ill , mouth breathing, ENT tms dull, nasal mucosa red , swollen, friable, tender maxillary,throat injected, neck supple, heart rsr lungs clear A/ rhinosinusitis P/ levaquin 500 mg #10  Supportive measures discussed. Follow up prn not improving

## 2016-10-06 DIAGNOSIS — E538 Deficiency of other specified B group vitamins: Secondary | ICD-10-CM | POA: Diagnosis not present

## 2016-10-08 DIAGNOSIS — L03031 Cellulitis of right toe: Secondary | ICD-10-CM | POA: Diagnosis not present

## 2016-10-08 DIAGNOSIS — L600 Ingrowing nail: Secondary | ICD-10-CM | POA: Diagnosis not present

## 2016-10-12 DIAGNOSIS — E538 Deficiency of other specified B group vitamins: Secondary | ICD-10-CM | POA: Diagnosis not present

## 2016-10-12 DIAGNOSIS — F5102 Adjustment insomnia: Secondary | ICD-10-CM | POA: Diagnosis not present

## 2016-10-12 DIAGNOSIS — R5381 Other malaise: Secondary | ICD-10-CM | POA: Diagnosis not present

## 2016-10-12 DIAGNOSIS — R5383 Other fatigue: Secondary | ICD-10-CM | POA: Diagnosis not present

## 2016-11-03 ENCOUNTER — Ambulatory Visit: Payer: Self-pay | Admitting: Physician Assistant

## 2016-11-03 ENCOUNTER — Encounter: Payer: Self-pay | Admitting: Physician Assistant

## 2016-11-03 VITALS — BP 139/79 | HR 111 | Temp 97.9°F

## 2016-11-03 DIAGNOSIS — J0100 Acute maxillary sinusitis, unspecified: Secondary | ICD-10-CM

## 2016-11-03 MED ORDER — METHYLPREDNISOLONE 4 MG PO TBPK: ORAL_TABLET | ORAL | 0 refills | Status: DC

## 2016-11-03 MED ORDER — AMOXICILLIN-POT CLAVULANATE 875-125 MG PO TABS: 1.0000 | ORAL_TABLET | Freq: Two times a day (BID) | ORAL | 0 refills | Status: DC

## 2016-11-03 NOTE — Progress Notes (Signed)
S: C/o sinus pain and congestion for 5 days, no fever, chills, cp/sob, v/d; mucus is green and thick, cough is sporadic, c/o of facial and dental pain.   Using otc meds: multiple  O: PE: vitals wnl, nad,  perrl eomi, normocephalic, tms dull, nasal mucosa red and swollen, throat injected, neck supple no lymph, lungs c t a, cv rrr, neuro intact  A:  Acute sinusitis   P: drink fluids, continue regular meds , use otc meds of choice, return if not improving in 5 days, return earlier if worsening, augmentin 875mg  bid x 10d, medrol dose pack

## 2017-08-11 DIAGNOSIS — Z3201 Encounter for pregnancy test, result positive: Secondary | ICD-10-CM | POA: Diagnosis not present

## 2017-08-11 DIAGNOSIS — N911 Secondary amenorrhea: Secondary | ICD-10-CM | POA: Diagnosis not present

## 2017-09-06 DIAGNOSIS — Z3689 Encounter for other specified antenatal screening: Secondary | ICD-10-CM | POA: Diagnosis not present

## 2017-09-06 DIAGNOSIS — O26891 Other specified pregnancy related conditions, first trimester: Secondary | ICD-10-CM | POA: Diagnosis not present

## 2017-09-06 DIAGNOSIS — Z3A08 8 weeks gestation of pregnancy: Secondary | ICD-10-CM | POA: Diagnosis not present

## 2017-09-06 DIAGNOSIS — Z124 Encounter for screening for malignant neoplasm of cervix: Secondary | ICD-10-CM | POA: Diagnosis not present

## 2017-09-06 DIAGNOSIS — Z1151 Encounter for screening for human papillomavirus (HPV): Secondary | ICD-10-CM | POA: Diagnosis not present

## 2017-09-06 DIAGNOSIS — Z3481 Encounter for supervision of other normal pregnancy, first trimester: Secondary | ICD-10-CM | POA: Diagnosis not present

## 2017-09-06 DIAGNOSIS — Z113 Encounter for screening for infections with a predominantly sexual mode of transmission: Secondary | ICD-10-CM | POA: Diagnosis not present

## 2017-09-06 LAB — OB RESULTS CONSOLE ABO/RH: RH TYPE: POSITIVE

## 2017-09-06 LAB — OB RESULTS CONSOLE GC/CHLAMYDIA
Chlamydia: NEGATIVE
Gonorrhea: NEGATIVE

## 2017-09-06 LAB — OB RESULTS CONSOLE ANTIBODY SCREEN: Antibody Screen: NEGATIVE

## 2017-09-06 LAB — OB RESULTS CONSOLE HEPATITIS B SURFACE ANTIGEN: Hepatitis B Surface Ag: NEGATIVE

## 2017-09-06 LAB — OB RESULTS CONSOLE RPR: RPR: NONREACTIVE

## 2017-09-06 LAB — OB RESULTS CONSOLE HIV ANTIBODY (ROUTINE TESTING): HIV: NONREACTIVE

## 2017-09-06 LAB — OB RESULTS CONSOLE RUBELLA ANTIBODY, IGM: Rubella: IMMUNE

## 2017-09-07 DIAGNOSIS — Z3689 Encounter for other specified antenatal screening: Secondary | ICD-10-CM | POA: Diagnosis not present

## 2017-09-07 DIAGNOSIS — Z1151 Encounter for screening for human papillomavirus (HPV): Secondary | ICD-10-CM | POA: Diagnosis not present

## 2017-09-22 DIAGNOSIS — Z0000 Encounter for general adult medical examination without abnormal findings: Secondary | ICD-10-CM | POA: Diagnosis not present

## 2017-09-22 DIAGNOSIS — E538 Deficiency of other specified B group vitamins: Secondary | ICD-10-CM | POA: Diagnosis not present

## 2017-10-06 DIAGNOSIS — Z3481 Encounter for supervision of other normal pregnancy, first trimester: Secondary | ICD-10-CM | POA: Diagnosis not present

## 2017-10-06 DIAGNOSIS — Z3A12 12 weeks gestation of pregnancy: Secondary | ICD-10-CM | POA: Diagnosis not present

## 2017-10-11 ENCOUNTER — Ambulatory Visit: Payer: Self-pay | Admitting: Physician Assistant

## 2017-10-11 ENCOUNTER — Encounter: Payer: Self-pay | Admitting: Physician Assistant

## 2017-10-11 VITALS — BP 149/83 | HR 103 | Temp 98.4°F | Resp 16

## 2017-10-11 DIAGNOSIS — J0120 Acute ethmoidal sinusitis, unspecified: Secondary | ICD-10-CM

## 2017-10-11 MED ORDER — AMOXICILLIN 875 MG PO TABS: 875.0000 mg | ORAL_TABLET | Freq: Two times a day (BID) | ORAL | 0 refills | Status: DC

## 2017-10-11 NOTE — Progress Notes (Signed)
   Subjective: Sinus congestion     Patient ID: Lindsay Parsons, female    DOB: April 14, 1986, 31 y.o.   MRN: [id number]  HPI Patient presents with 2 weeks of intermittent sinus congestion. Patient state unable to sleep secondary to postnasal drainage at night. Patient is [redacted] weeks gestation. Patient state talked to [facility] doctor and was recommended to take the over-the-counter cough preparation which has not helped. Patient said her nasal discharge is now thick and greenish. Patient also complained of frontal headache and and bilateral ear pressure. Patient has nausea which still bleeding is secondary to a gestation of state. Patient denies vomiting or diarrhea. No other palliative measures for her complaint.  Review of Systems  Second trimester gestation.    Objective:   Physical Exam HEENT is remarkable for bilateral maxillary guarding. Edematous nasal turbinates and thick greenish nasal discharge. Patient also has decreased forced volume. Neck is supple without adenopathy. Lungs are clear to auscultation heart is regular rate and rhythm.       Assessment & Plan: Sinusitis and laryngitis.   Patient given discharge care instructions. Patient stating the decongestants encounter indicated at this time. Patient get a prescription for amoxicillin and advise continue using cough preparation recommended by [facility] doctor. Follow with PCP if condition does not improve after taking antibiotics.

## 2017-11-03 DIAGNOSIS — J329 Chronic sinusitis, unspecified: Secondary | ICD-10-CM | POA: Diagnosis not present

## 2017-11-03 DIAGNOSIS — Z3A16 16 weeks gestation of pregnancy: Secondary | ICD-10-CM | POA: Diagnosis not present

## 2017-11-03 DIAGNOSIS — Z3482 Encounter for supervision of other normal pregnancy, second trimester: Secondary | ICD-10-CM | POA: Diagnosis not present

## 2017-11-09 NOTE — L&D Delivery Note (Signed)
Operative Delivery Note Pt complete with uncontrollable urge to push. She pushed for 20-39mins with fetal decelerations noted but great variability.  At 7:15 PM a viable female was delivered via Vaginal, Spontaneous.  Presentation: vertex; Position: Left,, Occiput,, Anterior; Station: +3.  Delivery of the head: 04/12/2018  7:13 PM First maneuver: 04/12/2018  7:13 PM, McRoberts Second maneuver: 04/12/2018  7:14 PM, Suprapubic Pressure Woods screw (fetal shoulder rotation) Third maneuver: 7:15pm Delivery of posterior shoulder   Fourth maneuver: ,   Fifth maneuver: ,   Sixth maneuver: ,     APGAR: 4, 8, ; weight  9lbs 4oz.   Placenta status: delivered intact with 3vc .   Cord:  with the following complications: none.  Cord pH: *7.13  Anesthesia:  Epidural; a dose of fentanyl given post delivery Episiotomy: None Lacerations: None Suture Repair: none Est. Blood Loss (mL): 300  Mom to postpartum.  Baby to Couplet care / Skin to Skin  Desires circ in hospital.  [place] 04/12/2018, 7:42 PM

## 2018-01-27 ENCOUNTER — Emergency Department: Payer: No Typology Code available for payment source

## 2018-01-27 ENCOUNTER — Encounter: Payer: Self-pay | Admitting: Emergency Medicine

## 2018-01-27 ENCOUNTER — Other Ambulatory Visit: Payer: Self-pay

## 2018-01-27 ENCOUNTER — Inpatient Hospital Stay
Admission: EM | Admit: 2018-01-27 | Discharge: 2018-01-29 | DRG: 832 | Disposition: A | Discharge status: Home / Self Care | Payer: No Typology Code available for payment source | Attending: Obstetrics and Gynecology | Admitting: Obstetrics and Gynecology

## 2018-01-27 DIAGNOSIS — O26613 Liver and biliary tract disorders in pregnancy, third trimester: Secondary | ICD-10-CM | POA: Diagnosis present

## 2018-01-27 DIAGNOSIS — N132 Hydronephrosis with renal and ureteral calculous obstruction: Secondary | ICD-10-CM | POA: Diagnosis present

## 2018-01-27 DIAGNOSIS — O9989 Other specified diseases and conditions complicating pregnancy, childbirth and the puerperium: Secondary | ICD-10-CM | POA: Diagnosis present

## 2018-01-27 DIAGNOSIS — N1330 Unspecified hydronephrosis: Secondary | ICD-10-CM | POA: Diagnosis not present

## 2018-01-27 DIAGNOSIS — N200 Calculus of kidney: Secondary | ICD-10-CM

## 2018-01-27 DIAGNOSIS — Z3A28 28 weeks gestation of pregnancy: Secondary | ICD-10-CM

## 2018-01-27 DIAGNOSIS — R1032 Left lower quadrant pain: Secondary | ICD-10-CM | POA: Diagnosis not present

## 2018-01-27 DIAGNOSIS — O26833 Pregnancy related renal disease, third trimester: Secondary | ICD-10-CM | POA: Diagnosis present

## 2018-01-27 DIAGNOSIS — K7689 Other specified diseases of liver: Secondary | ICD-10-CM | POA: Diagnosis present

## 2018-01-27 DIAGNOSIS — R160 Hepatomegaly, not elsewhere classified: Secondary | ICD-10-CM | POA: Diagnosis present

## 2018-01-27 DIAGNOSIS — R112 Nausea with vomiting, unspecified: Secondary | ICD-10-CM | POA: Diagnosis not present

## 2018-01-27 LAB — COMPREHENSIVE METABOLIC PANEL
ALT: 16 U/L (ref 14–54)
ANION GAP: 12 (ref 5–15)
AST: 28 U/L (ref 15–41)
Albumin: 3.4 g/dL — ABNORMAL LOW (ref 3.5–5.0)
Alkaline Phosphatase: 58 U/L (ref 38–126)
BUN: 18 mg/dL (ref 6–20)
CHLORIDE: 103 mmol/L (ref 101–111)
CO2: 19 mmol/L — ABNORMAL LOW (ref 22–32)
Calcium: 8.7 mg/dL — ABNORMAL LOW (ref 8.9–10.3)
Creatinine, Ser: 0.8 mg/dL (ref 0.44–1.00)
GFR calc non Af Amer: >60 mL/min (ref >60)
Glucose, Bld: 92 mg/dL (ref 65–99)
POTASSIUM: 3.7 mmol/L (ref 3.5–5.1)
SODIUM: 134 mmol/L — AB (ref 135–145)
Total Bilirubin: 0.5 mg/dL (ref 0.3–1.2)
Total Protein: 7.1 g/dL (ref 6.5–8.1)

## 2018-01-27 LAB — CBC WITH DIFFERENTIAL/PLATELET
Basophils Absolute: 0 10*3/uL (ref 0–0.1)
Basophils Relative: 0 %
EOS ABS: 0 10*3/uL (ref 0–0.7)
EOS PCT: 0 %
HCT: 39 % (ref 35.0–47.0)
Hemoglobin: 13.3 g/dL (ref 12.0–16.0)
Lymphocytes Relative: 13 %
Lymphs Abs: 1.8 10*3/uL (ref 1.0–3.6)
MCH: 32 pg (ref 26.0–34.0)
MCHC: 34.2 g/dL (ref 32.0–36.0)
MCV: 93.4 fL (ref 80.0–100.0)
Monocytes Absolute: 0.9 10*3/uL (ref 0.2–0.9)
Monocytes Relative: 6 %
Neutro Abs: 11.5 10*3/uL — ABNORMAL HIGH (ref 1.4–6.5)
Neutrophils Relative %: 81 %
PLATELETS: 266 10*3/uL (ref 150–440)
RBC: 4.17 MIL/uL (ref 3.80–5.20)
RDW: 13.5 % (ref 11.5–14.5)
WBC: 14.2 10*3/uL — AB (ref 3.6–11.0)

## 2018-01-27 LAB — URINALYSIS, COMPLETE (UACMP) WITH MICROSCOPIC
BACTERIA UA: NONE SEEN
BILIRUBIN URINE: NEGATIVE
Glucose, UA: 50 mg/dL — AB
Ketones, ur: 20 mg/dL — AB
LEUKOCYTES UA: NEGATIVE
NITRITE: NEGATIVE
PROTEIN: 100 mg/dL — AB
Specific Gravity, Urine: 1.028 (ref 1.005–1.030)
pH: 5 (ref 5.0–8.0)

## 2018-01-27 LAB — LIPASE, BLOOD: LIPASE: 29 U/L (ref 11–51)

## 2018-01-27 MED ORDER — FENTANYL CITRATE (PF) 100 MCG/2ML IJ SOLN
50.0000 ug | Freq: Once | INTRAMUSCULAR | Status: AC
  Administered 2018-01-27: 50 ug via INTRAVENOUS
  Filled 2018-01-27: qty 2

## 2018-01-27 MED ORDER — ACETAMINOPHEN 500 MG PO TABS
1000.0000 mg | ORAL_TABLET | Freq: Once | ORAL | Status: AC
  Administered 2018-01-27: 1000 mg via ORAL
  Filled 2018-01-27: qty 2

## 2018-01-27 MED ORDER — SODIUM CHLORIDE 0.9 % IV BOLUS (SEPSIS)
500.0000 mL | Freq: Once | INTRAVENOUS | Status: AC
  Administered 2018-01-27: 500 mL via INTRAVENOUS

## 2018-01-27 MED ORDER — MORPHINE SULFATE (PF) 4 MG/ML IV SOLN
INTRAVENOUS | Status: AC
  Administered 2018-01-27: 4 mg
  Filled 2018-01-27: qty 1

## 2018-01-27 MED ORDER — PROMETHAZINE HCL 25 MG PO TABS
25.0000 mg | ORAL_TABLET | Freq: Four times a day (QID) | ORAL | Status: DC | PRN
  Filled 2018-01-27: qty 1

## 2018-01-27 MED ORDER — ACETAMINOPHEN 650 MG RE SUPP
650.0000 mg | Freq: Four times a day (QID) | RECTAL | Status: DC | PRN
  Filled 2018-01-27: qty 1

## 2018-01-27 MED ORDER — ONDANSETRON HCL 4 MG/2ML IJ SOLN
INTRAMUSCULAR | Status: AC
  Administered 2018-01-27: 4 mg via INTRAVENOUS
  Filled 2018-01-27: qty 2

## 2018-01-27 MED ORDER — METOCLOPRAMIDE HCL 5 MG/ML IJ SOLN
10.0000 mg | Freq: Once | INTRAMUSCULAR | Status: AC
  Administered 2018-01-27: 10 mg via INTRAVENOUS
  Filled 2018-01-27: qty 2

## 2018-01-27 MED ORDER — ONDANSETRON HCL 4 MG/2ML IJ SOLN
4.0000 mg | Freq: Once | INTRAMUSCULAR | Status: AC
Start: 2018-01-27 — End: 2018-01-27
  Administered 2018-01-27: 4 mg via INTRAVENOUS

## 2018-01-27 MED ORDER — ACETAMINOPHEN 325 MG PO TABS
650.0000 mg | ORAL_TABLET | Freq: Four times a day (QID) | ORAL | Status: DC | PRN
  Administered 2018-01-28 – 2018-01-29 (×6): 650 mg via ORAL
  Filled 2018-01-27 (×6): qty 2

## 2018-01-27 MED ORDER — HYDROMORPHONE HCL 1 MG/ML IJ SOLN
1.0000 mg | INTRAMUSCULAR | Status: DC | PRN
  Administered 2018-01-27 – 2018-01-28 (×3): 1 mg via INTRAVENOUS
  Administered 2018-01-28 (×3): 2 mg via INTRAVENOUS
  Administered 2018-01-28 – 2018-01-29 (×3): 1 mg via INTRAVENOUS
  Filled 2018-01-27: qty 2
  Filled 2018-01-27 (×3): qty 1
  Filled 2018-01-27 (×3): qty 2
  Filled 2018-01-27 (×3): qty 1

## 2018-01-27 MED ORDER — ONDANSETRON HCL 4 MG/2ML IJ SOLN
4.0000 mg | Freq: Four times a day (QID) | INTRAMUSCULAR | Status: DC | PRN
  Administered 2018-01-27 – 2018-01-28 (×2): 4 mg via INTRAVENOUS
  Filled 2018-01-27: qty 2

## 2018-01-27 MED ORDER — ONDANSETRON HCL 4 MG PO TABS: 4.0000 mg | ORAL_TABLET | Freq: Four times a day (QID) | ORAL | Status: DC | PRN

## 2018-01-27 MED ORDER — PRENATAL MULTIVITAMIN CH
1.0000 | ORAL_TABLET | Freq: Every day | ORAL | Status: DC
  Administered 2018-01-28: 1 via ORAL
  Filled 2018-01-27: qty 1

## 2018-01-27 MED ORDER — OXYCODONE HCL 5 MG PO TABS
5.0000 mg | ORAL_TABLET | ORAL | Status: DC | PRN
  Administered 2018-01-27 – 2018-01-28 (×3): 5 mg via ORAL
  Filled 2018-01-27 (×3): qty 1

## 2018-01-27 MED ORDER — LACTATED RINGERS IV SOLN
INTRAVENOUS | Status: DC
  Administered 2018-01-27 – 2018-01-29 (×6): via INTRAVENOUS

## 2018-01-27 NOTE — ED Notes (Signed)
During triage pt had 3 episodes of green bilious vomiting, totaling approx 300 cc's of fluid.

## 2018-01-27 NOTE — OB Triage Note (Signed)
Patient arrived to Obs 4 from ED en route to M/B for NST. Pt reports positive fetal movement, denies leaking of fluid, vaginal bleeding, or contractions. States abdominal pain is constant, does not feel like contractions. EFM applied and assessing.

## 2018-01-27 NOTE — ED Triage Notes (Signed)
Pt was at work when she had sudden onset L mid-lower abdominal pain, starting at approx 2pm with +nausea, no vomiting. Pt is approx 28 week 4 days pregnant. Pt states pain has been constant without relief.

## 2018-01-27 NOTE — H&P (Signed)
History and Physical  Lindsay Parsons is an 32 y.o. female.   HPI: Patient was working when she began having left sided flank pain, nausea and vomiting. Evaluation in the ER showed a renal stone. Patient's pain is poorly controlled despite IV pain medications. She describes her pain as sharp and constant. She feels it more in the front and wrapping around to the back. She has had dark reddish urine. She denies fevers. She does not have a history of renal stone. She is feeling fetal movement. Denies contractions, leakage of fluid or vaginal bleeding. She reports that her pregnancy has been thus far uncomplicated by gestational diabetes or hypertension.   OB History    Gravida  2   Para  1   Term  1   Preterm      AB      Living  1     SAB      TAB      Ectopic      Multiple  0   Live Births  1          2017 normal spontanteous vaginal delivery.  Past Medical History:  Diagnosis Date  . SVT (supraventricular tachycardia) (HCC)     Past Surgical History:  Procedure Laterality Date  . FOOT SURGERY  2007   cyst removed from left foot  . TONSILLECTOMY  1989    Family History  Problem Relation Age of Onset  . Cancer Father   . Hyperlipidemia Father   . Cancer Maternal Grandmother   . Cancer Maternal Grandfather   . Cancer Paternal Grandfather   . Hypertension Paternal Grandfather     Social History:  reports that she has never smoked. She has never used smokeless tobacco. She reports that she does not drink alcohol or use drugs.  Allergies:  Allergies  Allergen Reactions  . Cephalosporins Hives    Medications: I have reviewed the patient's current medications.  Results for orders placed or performed during the hospital encounter of 01/27/18 (from the past 48 hour(s))  Urinalysis, Complete w Microscopic     Status: Abnormal   Collection Time: 01/27/18  5:38 PM  Result Value Ref Range   Color, Urine YELLOW (A) YELLOW   APPearance HAZY (A) CLEAR   Specific Gravity, Urine 1.028 1.005 - 1.030   pH 5.0 5.0 - 8.0   Glucose, UA 50 (A) NEGATIVE mg/dL   Hgb urine dipstick LARGE (A) NEGATIVE   Bilirubin Urine NEGATIVE NEGATIVE   Ketones, ur 20 (A) NEGATIVE mg/dL   Protein, ur 100 (A) NEGATIVE mg/dL   Nitrite NEGATIVE NEGATIVE   Leukocytes, UA NEGATIVE NEGATIVE   RBC / HPF TOO NUMEROUS TO COUNT 0 - 5 RBC/hpf   WBC, UA 0-5 0 - 5 WBC/hpf   Bacteria, UA NONE SEEN NONE SEEN   Squamous Epithelial / LPF 0-5 (A) NONE SEEN   Mucus PRESENT     Comment: Performed at [facility], [address] [place], [place] [address]  CBC with Differential/Platelet     Status: Abnormal   Collection Time: 01/27/18  5:38 PM  Result Value Ref Range   WBC 14.2 (H) 3.6 - 11.0 K/uL   RBC 4.17 3.80 - 5.20 MIL/uL   Hemoglobin 13.3 12.0 - 16.0 g/dL   HCT 39.0 35.0 - 47.0 %   MCV 93.4 80.0 - 100.0 fL   MCH 32.0 26.0 - 34.0 pg   MCHC 34.2 32.0 - 36.0 g/dL   RDW 13.5 11.5 -  14.5 %   Platelets 266 150 - 440 K/uL   Neutrophils Relative % 81 %   Neutro Abs 11.5 (H) 1.4 - 6.5 K/uL   Lymphocytes Relative 13 %   Lymphs Abs 1.8 1.0 - 3.6 K/uL   Monocytes Relative 6 %   Monocytes Absolute 0.9 0.2 - 0.9 K/uL   Eosinophils Relative 0 %   Eosinophils Absolute 0.0 0 - 0.7 K/uL   Basophils Relative 0 %   Basophils Absolute 0.0 0 - 0.1 K/uL    Comment: Performed at [facility], [address] [place], [place] [address]  Comprehensive metabolic panel     Status: Abnormal   Collection Time: 01/27/18  5:38 PM  Result Value Ref Range   Sodium 134 (L) 135 - 145 mmol/L   Potassium 3.7 3.5 - 5.1 mmol/L   Chloride 103 101 - 111 mmol/L   CO2 19 (L) 22 - 32 mmol/L   Glucose, Bld 92 65 - 99 mg/dL   BUN 18 6 - 20 mg/dL   Creatinine, Ser 0.80 0.44 - 1.00 mg/dL   Calcium 8.7 (L) 8.9 - 10.3 mg/dL   Total Protein 7.1 6.5 - 8.1 g/dL   Albumin 3.4 (L) 3.5 - 5.0 g/dL   AST 28 15 - 41 U/L   ALT 16 14 - 54 U/L   Alkaline Phosphatase 58 38 - 126 U/L   Total  Bilirubin 0.5 0.3 - 1.2 mg/dL   GFR calc non Af Amer >60 >60 mL/min   GFR calc Af Amer >60 >60 mL/min    Comment: (NOTE) The eGFR has been calculated using the CKD EPI equation. This calculation has not been validated in all clinical situations. eGFR's persistently <60 mL/min signify possible Chronic Kidney Disease.    Anion gap 12 5 - 15    Comment: Performed at [facility], [address] [place],  [address]  Lipase, blood     Status: None   Collection Time: 01/27/18  5:38 PM  Result Value Ref Range   Lipase 29 11 - 51 U/L    Comment: Performed at [facility], [address] [place],  [address]    US Renal  Result Date: 01/27/2018 CLINICAL DATA:  Left flank pain with hematuria, [redacted] weeks pregnant EXAM: RENAL / URINARY TRACT ULTRASOUND COMPLETE COMPARISON:  None. FINDINGS: Right Kidney: Length: 12.9 cm. Echogenicity within normal limits. No mass or hydronephrosis visualized. Left Kidney: Length: 12.7 cm. Cortical echogenicity within normal limits. 8 mm probable stone in the lower pole. Mild left hydronephrosis. Bladder: Not well seen, decompressed. Incidental finding of a hypoechoic mass in the posterior right hepatic lobe measuring 5.6 x 4.8 x 4.7 cm. IMPRESSION: 1. Mild left hydronephrosis. Probable 8 mm stone in the lower pole of the left kidney. 2. 5.6 cm hypoechoic solid-appearing mass in the posterior right hepatic lobe. MRI evaluation when clinically feasible is recommended. Electronically Signed   By: Donavan Foil M.D.   On: 01/27/2018 19:59    Review of Systems  Constitutional: Negative for chills, fever, malaise/fatigue and weight loss.  HENT: Negative for congestion, hearing loss and sinus pain.   Eyes: Negative for blurred vision and double vision.  Respiratory: Negative for cough, sputum production, shortness of breath and wheezing.   Cardiovascular: Negative for chest pain, palpitations, orthopnea and leg swelling.  Gastrointestinal:  Positive for abdominal pain, nausea and vomiting. Negative for constipation and diarrhea.  Genitourinary: Positive for flank pain and hematuria. Negative for dysuria, frequency and  urgency.  Musculoskeletal: Positive for back pain. Negative for falls and joint pain.  Skin: Negative for itching and rash.  Neurological: Negative for dizziness and headaches.  Psychiatric/Behavioral: Negative for depression, substance abuse and suicidal ideas. The patient is not nervous/anxious.    Blood pressure 130/83, pulse (!) 104, temperature 97.6 F (36.4 C), temperature source Oral, resp. rate 20, height '5\' 2"'$  (1.575 m), weight 190 lb (86.2 kg), SpO2 98 %, unknown if currently breastfeeding. Physical Exam  Nursing note and vitals reviewed. Constitutional: She is oriented to person, place, and time. She appears well-developed and well-nourished.  HENT:  Head: Normocephalic and atraumatic.  Cardiovascular: Normal rate, regular rhythm and normal heart sounds.  Respiratory: Effort normal and breath sounds normal.  GI: Soft. Bowel sounds are normal.  Left sided pain   Musculoskeletal: Normal range of motion.  Neurological: She is alert and oriented to person, place, and time.  Skin: Skin is warm and dry.  Psychiatric: She has a normal mood and affect. Her behavior is normal. Judgment and thought content normal.    Assessment/Plan: 32yo G2P1001 at 28wks 5 days gestational age 45. Left renal stone- will admit for pain control and IV fluid hydration. ER has consulted urology who recommended expectant management for 24 hours.  2. Pregnancy- will continue PNV. Daily NST and fetal heart tones every 8 hours.  3.  Liver Mass in posterior right hepatic lobe, will need follow up MRI.  Christanna R Schuman 01/27/2018, 8:45 PM

## 2018-01-27 NOTE — ED Provider Notes (Signed)
[facility] Emergency Department Provider Note  ____________________________________________  Time seen: Approximately 5:31 PM  I have reviewed the triage vital signs and the nursing notes.   HISTORY  Chief Complaint Abdominal Pain   HPI Lindsay Parsons is a 32 y.o. female G1P0 at [redacted] weeks GA who presents for evaluation of abdominal pain. patient reports pain started 3 hours ago, sharp, constant, located in the left abdominal region radiating to the left flank. Associated with nausea and 1 episode of nonbloody nonbilious emesis. No fever, no dysuria, no hematuria, no diarrhea, no constipation, no vaginal bleeding, no vaginal fluid loss. She reports normal fetal movement. No complications during this pregnancy. No prior abdominal surgeries.  Past Medical History:  Diagnosis Date  . SVT (supraventricular tachycardia) [facility])     Patient Active Problem List   Diagnosis Date Noted  . Active labor 02/16/2016  . SVD (spontaneous vaginal delivery) 02/16/2016  . Postpartum care following vaginal delivery 02/16/2016    Past Surgical History:  Procedure Laterality Date  . FOOT SURGERY  2007   cyst removed from left foot  . TONSILLECTOMY  1989    Prior to Admission medications   Medication Sig Start Date End Date Taking? Authorizing Provider  acetaminophen (TYLENOL) 325 MG tablet Take 650 mg by mouth every 6 (six) hours as needed for mild pain or headache.    [provider]  amoxicillin (AMOXIL) 875 MG tablet Take 1 tablet (875 mg total) by mouth 2 (two) times daily. 10/11/17   Sable Feil, PA-C  Prenatal Vit-Fe Fumarate-FA (PRENATAL MULTIVITAMIN) TABS tablet Take 1 tablet by mouth daily at 12 noon.    [provider]    Allergies Cephalosporins  Family History  Problem Relation Age of Onset  . Cancer Father   . Hyperlipidemia Father   . Cancer Maternal Grandmother   . Cancer Maternal Grandfather   . Cancer Paternal  Grandfather   . Hypertension Paternal Grandfather     Social History Social History   Tobacco Use  . Smoking status: Never Smoker  . Smokeless tobacco: Never Used  Substance Use Topics  . Alcohol use: No  . Drug use: No    Review of Systems  Constitutional: Negative for fever. Eyes: Negative for visual changes. ENT: Negative for sore throat. Neck: No neck pain  Cardiovascular: Negative for chest pain. Respiratory: Negative for shortness of breath. Gastrointestinal: + abdominal pain, nausea, and vomiting. No diarrhea. Genitourinary: Negative for dysuria. Musculoskeletal: Negative for back pain. Skin: Negative for rash. Neurological: Negative for headaches, weakness or numbness. Psych: No SI or HI  ____________________________________________   PHYSICAL EXAM:  VITAL SIGNS: ED Triage Vitals [01/27/18 1729]  Enc Vitals Group     BP 131/81     Pulse Rate (!) 103     Resp (!) 24     Temp 97.6 F (36.4 C)     Temp Source Oral     SpO2 99 %     Weight 190 lb (86.2 kg)     Height 5\' 2"  (1.575 m)     Head Circumference      Peak Flow      Pain Score 7     Pain Loc      Pain Edu?      Excl. in [place]?     Constitutional: Alert and oriented, he looks uncomfortable, pacing.  HEENT:      Head: Normocephalic and atraumatic.         Eyes:  Conjunctivae are normal. Sclera is non-icteric.       Mouth/Throat: Mucous membranes are moist.       Neck: Supple with no signs of meningismus. Cardiovascular: tachycardia with regular rhythm. No murmurs, gallops, or rubs. 2+ symmetrical distal pulses are present in all extremities. No JVD. Respiratory: Normal respiratory effort. Lungs are clear to auscultation bilaterally. No wheezes, crackles, or rhonchi.  Gastrointestinal: Gravid, non tender, and non distended with positive bowel sounds. No rebound or guarding. Genitourinary: No CVA tenderness. Musculoskeletal: Nontender with normal range of motion in all extremities. No edema,  cyanosis, or erythema of extremities. Neurologic: Normal speech and language. Face is symmetric. Moving all extremities. No gross focal neurologic deficits are appreciated. Skin: Skin is warm, dry and intact. No rash noted. Psychiatric: Mood and affect are normal. Speech and behavior are normal.  ____________________________________________   LABS (all labs ordered are listed, but only abnormal results are displayed)  Labs Reviewed  URINALYSIS, COMPLETE (UACMP) WITH MICROSCOPIC - Abnormal; Notable for the following components:      Result Value   Color, Urine YELLOW (*)    APPearance HAZY (*)    Glucose, UA 50 (*)    Hgb urine dipstick LARGE (*)    Ketones, ur 20 (*)    Protein, ur 100 (*)    Squamous Epithelial / LPF 0-5 (*)    All other components within normal limits  CBC WITH DIFFERENTIAL/PLATELET - Abnormal; Notable for the following components:   WBC 14.2 (*)    Neutro Abs 11.5 (*)    All other components within normal limits  COMPREHENSIVE METABOLIC PANEL - Abnormal; Notable for the following components:   Sodium 134 (*)    CO2 19 (*)    Calcium 8.7 (*)    Albumin 3.4 (*)    All other components within normal limits  LIPASE, BLOOD   ____________________________________________  EKG  none ____________________________________________  RADIOLOGY  I have personally reviewed the images performed during this visit and I agree with the Radiologist's read.   Interpretation by Radiologist:  US Renal  Result Date: 01/27/2018 CLINICAL DATA:  Left flank pain with hematuria, [redacted] weeks pregnant EXAM: RENAL / URINARY TRACT ULTRASOUND COMPLETE COMPARISON:  None. FINDINGS: Right Kidney: Length: 12.9 cm. Echogenicity within normal limits. No mass or hydronephrosis visualized. Left Kidney: Length: 12.7 cm. Cortical echogenicity within normal limits. 8 mm probable stone in the lower pole. Mild left hydronephrosis. Bladder: Not well seen, decompressed. Incidental finding of a  hypoechoic mass in the posterior right hepatic lobe measuring 5.6 x 4.8 x 4.7 cm. IMPRESSION: 1. Mild left hydronephrosis. Probable 8 mm stone in the lower pole of the left kidney. 2. 5.6 cm hypoechoic solid-appearing mass in the posterior right hepatic lobe. MRI evaluation when clinically feasible is recommended. Electronically Signed   By: Donavan Foil M.D.   On: 01/27/2018 19:59     ____________________________________________   PROCEDURES  Procedure(s) performed: None Procedures Critical Care performed:  None ____________________________________________   INITIAL IMPRESSION / ASSESSMENT AND PLAN / ED COURSE   32 y.o. female G1P0 at [redacted] weeks GA who presents for evaluation of L sided sharp abdominal pain, nausea, and vomiting x 3 hours. patient looks very uncomfortable, pacing, afebrile, slightly tachycardic however that's normal this far out in her pregnancy, normal blood pressure, bedside ultrasound showing normal fetal movement and fetal heart rate of 142. Ddx pyelo, hydronephrosis, kidney stone, contractions, round ligament pain, diverticulitis. Less likely appendicitis with no pain on the R  Clinical  Course as of Jan 27 2026  Thu Jan 27, 2018  8592 Urinalysis showing hematuria with no evidence of infection concerning for kidney stone.  We will send patient for a renal ultrasound.  Will give a small dose of fentanyl for pain.   [CV]    Clinical Course User Index [CV] Alfred Levins, [place], MD   _________________________ 8:25 PM on 01/27/2018 ----------------------------------------- Ultrasound showing a 8 mm stone in the left pole of the left kidney and mild left-sided hydronephrosis consistent with a mild urethral stone.  Patient continues to have persistent pain and nausea.  I spoke with Dr. Pilar Jarvis, urology recommended admission to OB/GYN for pain control but no acute procedures at this time.  I discussed with Dr. Gilman Schmidt, obgyn who will admit patient for pain control and  hydration. Discussed with the patient incidental finding of liver mass and recommended outpatient follow up for further imaging.    As part of my medical decision making, I reviewed the following data within the [facility] notes reviewed and incorporated, Labs reviewed , Radiograph reviewed , A consult was requested and obtained from this/these consultant(s) ObGYN and Urology, Notes from prior ED visits and [place] Controlled Substance Database    Pertinent labs & imaging results that were available during my care of the patient were reviewed by me and considered in my medical decision making (see chart for details).    ____________________________________________   FINAL CLINICAL IMPRESSION(S) / ED DIAGNOSES  Final diagnoses:  Calculus of kidney affecting pregnancy in third trimester      NEW MEDICATIONS STARTED DURING THIS VISIT:  ED Discharge Orders    None       Note:  This document was prepared using Dragon voice recognition software and may include unintentional dictation errors.    Rudene Re, MD 01/27/18 2027

## 2018-01-28 ENCOUNTER — Encounter: Payer: Self-pay | Admitting: Anesthesiology

## 2018-01-28 DIAGNOSIS — Z3A28 28 weeks gestation of pregnancy: Secondary | ICD-10-CM

## 2018-01-28 DIAGNOSIS — O26833 Pregnancy related renal disease, third trimester: Secondary | ICD-10-CM

## 2018-01-28 DIAGNOSIS — O26613 Liver and biliary tract disorders in pregnancy, third trimester: Secondary | ICD-10-CM

## 2018-01-28 DIAGNOSIS — R160 Hepatomegaly, not elsewhere classified: Secondary | ICD-10-CM

## 2018-01-28 DIAGNOSIS — N132 Hydronephrosis with renal and ureteral calculous obstruction: Secondary | ICD-10-CM

## 2018-01-28 LAB — BASIC METABOLIC PANEL
ANION GAP: 10 (ref 5–15)
BUN: 14 mg/dL (ref 6–20)
CHLORIDE: 108 mmol/L (ref 101–111)
CO2: 18 mmol/L — AB (ref 22–32)
Calcium: 8.1 mg/dL — ABNORMAL LOW (ref 8.9–10.3)
Creatinine, Ser: 1.07 mg/dL — ABNORMAL HIGH (ref 0.44–1.00)
GFR calc Af Amer: >60 mL/min (ref >60)
GFR calc non Af Amer: >60 mL/min (ref >60)
GLUCOSE: 94 mg/dL (ref 65–99)
POTASSIUM: 3.8 mmol/L (ref 3.5–5.1)
Sodium: 136 mmol/L (ref 135–145)

## 2018-01-28 MED ORDER — TAMSULOSIN HCL 0.4 MG PO CAPS
0.4000 mg | ORAL_CAPSULE | Freq: Every day | ORAL | Status: DC
  Administered 2018-01-28: 0.4 mg via ORAL
  Filled 2018-01-28: qty 1

## 2018-01-28 MED ORDER — OXYCODONE HCL 5 MG PO TABS
15.0000 mg | ORAL_TABLET | ORAL | Status: DC | PRN
  Administered 2018-01-28 – 2018-01-29 (×4): 15 mg via ORAL
  Filled 2018-01-28 (×4): qty 3

## 2018-01-28 MED ORDER — DOCUSATE SODIUM 100 MG PO CAPS
100.0000 mg | ORAL_CAPSULE | Freq: Two times a day (BID) | ORAL | Status: DC
  Administered 2018-01-28 – 2018-01-29 (×2): 100 mg via ORAL
  Filled 2018-01-28 (×2): qty 1

## 2018-01-28 NOTE — Plan of Care (Signed)
To mother/baby unit at 2315 on 01-27-18; had NST prior to coming to mother/baby and that was WNL; pain meds (tylenol, roxicodone, dilauded) as needed; nausea meds (zofran and phenergan) as needed; strain all urine

## 2018-01-28 NOTE — Progress Notes (Signed)
Antepartum Progress Note  Lindsay Parsons is a 32 year old G2P1001 at 28w 6d currently admitted for kidney stones. She has had dark urine, extreme pain in her left flank, and nausea and vomiting related to poor pain control.  S: Supine in bed, grimacing, moving in bed but unable to get comfortable. Pain control is minimal with PRN medication. She voices wanting to go home but says that her pain, although improved at the beginning of her admission, is now mostly unrelieved despite multiple pain medications, currently 8-9/10. She has only been taking 1 mg of PRN Dilaudid although she could have 2 mg for severe pain.  O: Blood pressure 109/70, pulse 85, temperature 97.9 F (36.6 C), temperature source Oral, resp. rate 18, height 5\' 2"  (1.575 m), weight 190 lb (86.2 kg),  SpO2 98 %.  FHT: 132  Physical Exam  Constitutional: She is oriented to person, place, and time. She appears distressed.  HENT:  Head: Normocephalic and atraumatic.  Pulmonary/Chest: Effort normal.  Abdominal: There is tenderness. There is guarding.  Musculoskeletal: Normal range of motion.  Neurological: She is alert and oriented to person, place, and time.  Psychiatric: She has a normal mood and affect. Her behavior is normal.   A: 32 year old female at 74w 6d gestation with kidney stones, pain not well-controlled.  P: Urology consult to determine management of kidney stones. Encouraged patient to accept entire scheduled doses of pain medication, reassured that fetal exposure short-term should be safe. Continue daily NST and FHT q 8 hours.  Avel Sensor, CNM 01/28/2018

## 2018-01-28 NOTE — Consult Note (Signed)
Urology Consult  Referring physician: Dr. Gilman Schmidt Reason for referral: left hydronephrosis, concern for ureteral calculus  Chief Complaint: Left flank pain  History of Present Illness: Lindsay Parsons is a 32yo G2P1 28w 6d with no significant past medical history who presented to the ER on 3/21 with severe left flank pain. The pain started that afternoon and is sharp, severe, constant, nonradiating left flank pain with associated nausea and vomiting. This is her first stone event. Renal US obtained in the ER showed a left 21m lower pole calculus and mild left hydronephrosis. He has new urinary urgency and frequency but no dysuria of gross hematuria. Pain is poorly controlled on prn dilaudid and oxycodone but the patient has missed multiple doses to to concern of taking narcotics when pregnant. Father has hx of nephrolithiasis  Past Medical History:  Diagnosis Date  . SVT (supraventricular tachycardia) (HCC)    Past Surgical History:  Procedure Laterality Date  . FOOT SURGERY  2007   cyst removed from left foot  . TONSILLECTOMY  1989    Medications: I have reviewed the patient's current medications. Allergies:  Allergies  Allergen Reactions  . Cephalosporins Hives    Family History  Problem Relation Age of Onset  . Cancer Father   . Hyperlipidemia Father   . Cancer Maternal Grandmother   . Cancer Maternal Grandfather   . Cancer Paternal Grandfather   . Hypertension Paternal Grandfather    Social History:  reports that she has never smoked. She has never used smokeless tobacco. She reports that she does not drink alcohol or use drugs.  Review of Systems  Gastrointestinal: Positive for abdominal pain, nausea and vomiting.  Genitourinary: Positive for flank pain, frequency and urgency.  All other systems reviewed and are negative.   Physical Exam:  Vital signs in last 24 hours: Temp:  [97.5 F (36.4 C)-98.8 F (37.1 C)] 98.1 F (36.7 C) (03/22 1555) Pulse Rate:  [85-109] 88  (03/22 1555) Resp:  [13-30] 18 (03/22 1555) BP: (109-137)/(64-97) 112/64 (03/22 1555) SpO2:  [95 %-98 %] 96 % (03/22 1555) Physical Exam  Constitutional: She is oriented to person, place, and time. She appears well-developed and well-nourished.  HENT:  Head: Normocephalic and atraumatic.  Eyes: Pupils are equal, round, and reactive to light. EOM are normal.  Neck: Normal range of motion. No thyromegaly present.  Cardiovascular: Normal rate and regular rhythm.  Respiratory: Effort normal. No respiratory distress.  GI: Soft. She exhibits no distension.  Musculoskeletal: Normal range of motion. She exhibits no edema.  Neurological: She is alert and oriented to person, place, and time.  Skin: Skin is warm and dry.  Psychiatric: She has a normal mood and affect. Her behavior is normal. Judgment and thought content normal.    Laboratory Data:  Results for orders placed or performed during the hospital encounter of 01/27/18 (from the past 72 hour(s))  Urinalysis, Complete w Microscopic     Status: Abnormal   Collection Time: 01/27/18  5:38 PM  Result Value Ref Range   Color, Urine YELLOW (A) YELLOW   APPearance HAZY (A) CLEAR   Specific Gravity, Urine 1.028 1.005 - 1.030   pH 5.0 5.0 - 8.0   Glucose, UA 50 (A) NEGATIVE mg/dL   Hgb urine dipstick LARGE (A) NEGATIVE   Bilirubin Urine NEGATIVE NEGATIVE   Ketones, ur 20 (A) NEGATIVE mg/dL   Protein, ur 100 (A) NEGATIVE mg/dL   Nitrite NEGATIVE NEGATIVE   Leukocytes, UA NEGATIVE NEGATIVE   RBC / HPF  TOO NUMEROUS TO COUNT 0 - 5 RBC/hpf   WBC, UA 0-5 0 - 5 WBC/hpf   Bacteria, UA NONE SEEN NONE SEEN   Squamous Epithelial / LPF 0-5 (A) NONE SEEN   Mucus PRESENT     Comment: Performed at [facility], [address] [place], [place] [address]  CBC with Differential/Platelet     Status: Abnormal   Collection Time: 01/27/18  5:38 PM  Result Value Ref Range   WBC 14.2 (H) 3.6 - 11.0 K/uL   RBC 4.17 3.80 - 5.20 MIL/uL   Hemoglobin  13.3 12.0 - 16.0 g/dL   HCT 39.0 35.0 - 47.0 %   MCV 93.4 80.0 - 100.0 fL   MCH 32.0 26.0 - 34.0 pg   MCHC 34.2 32.0 - 36.0 g/dL   RDW 13.5 11.5 - 14.5 %   Platelets 266 150 - 440 K/uL   Neutrophils Relative % 81 %   Neutro Abs 11.5 (H) 1.4 - 6.5 K/uL   Lymphocytes Relative 13 %   Lymphs Abs 1.8 1.0 - 3.6 K/uL   Monocytes Relative 6 %   Monocytes Absolute 0.9 0.2 - 0.9 K/uL   Eosinophils Relative 0 %   Eosinophils Absolute 0.0 0 - 0.7 K/uL   Basophils Relative 0 %   Basophils Absolute 0.0 0 - 0.1 K/uL    Comment: Performed at [facility], [address] [place], [place] [address]  Comprehensive metabolic panel     Status: Abnormal   Collection Time: 01/27/18  5:38 PM  Result Value Ref Range   Sodium 134 (L) 135 - 145 mmol/L   Potassium 3.7 3.5 - 5.1 mmol/L   Chloride 103 101 - 111 mmol/L   CO2 19 (L) 22 - 32 mmol/L   Glucose, Bld 92 65 - 99 mg/dL   BUN 18 6 - 20 mg/dL   Creatinine, Ser 0.80 0.44 - 1.00 mg/dL   Calcium 8.7 (L) 8.9 - 10.3 mg/dL   Total Protein 7.1 6.5 - 8.1 g/dL   Albumin 3.4 (L) 3.5 - 5.0 g/dL   AST 28 15 - 41 U/L   ALT 16 14 - 54 U/L   Alkaline Phosphatase 58 38 - 126 U/L   Total Bilirubin 0.5 0.3 - 1.2 mg/dL   GFR calc non Af Amer >60 >60 mL/min   GFR calc Af Amer >60 >60 mL/min    Comment: (NOTE) The eGFR has been calculated using the CKD EPI equation. This calculation has not been validated in all clinical situations. eGFR's persistently <60 mL/min signify possible Chronic Kidney Disease.    Anion gap 12 5 - 15    Comment: Performed at [facility], [address] [place], [place] [address]  Lipase, blood     Status: None   Collection Time: 01/27/18  5:38 PM  Result Value Ref Range   Lipase 29 11 - 51 U/L    Comment: Performed at [facility], [address] [place], [place] [address]  Basic metabolic panel     Status: Abnormal   Collection Time: 01/28/18  5:07 AM  Result Value Ref Range   Sodium 136 135 - 145  mmol/L   Potassium 3.8 3.5 - 5.1 mmol/L   Chloride 108 101 - 111 mmol/L   CO2 18 (L) 22 - 32 mmol/L   Glucose, Bld 94 65 - 99 mg/dL   BUN 14 6 - 20 mg/dL   Creatinine, Ser 1.07 (H) 0.44 - 1.00 mg/dL  Calcium 8.1 (L) 8.9 - 10.3 mg/dL   GFR calc non Af Amer >60 >60 mL/min   GFR calc Af Amer >60 >60 mL/min    Comment: (NOTE) The eGFR has been calculated using the CKD EPI equation. This calculation has not been validated in all clinical situations. eGFR's persistently <60 mL/min signify possible Chronic Kidney Disease.    Anion gap 10 5 - 15    Comment: Performed at [facility], [address] [place], [place] [address]   No results found for this or any previous visit (from the past 240 hour(s)). Creatinine: Recent Labs    01/27/18 1738 01/28/18 0507  CREATININE 0.80 1.07*   Baseline Creatinine: 0.8  Impression/Assessment:  32yo with left hydronephrosis, probably left ureteral calculus  Plan:  1. I discussed the management of ureteral calculi with the patient including medical expulsive therapy, ureteral stent placement, and nephrostomy tube placement. The patient wishes at this time to pursue medical expulsive therapy. Patient started on flomax. She is instructed to take the pain medication if she is in pain and to not wait until the pain is severe before requesting a medication. We have agreed to reassess her tomorrow morning and if her pain is uncontrolled we will likely proceed with ureteral stent placement. Please continue to strain her urine. NPO after midnight.  Nicolette Bang 01/28/2018, 6:20 PM

## 2018-01-28 NOTE — Anesthesia Preprocedure Evaluation (Deleted)
Anesthesia Evaluation    Airway        Dental   Pulmonary           Cardiovascular      Neuro/Psych    GI/Hepatic   Endo/Other    Renal/GU Renal disease     Musculoskeletal   Abdominal   Peds  Hematology   Anesthesia Other Findings   Reproductive/Obstetrics (+) Pregnancy                             Anesthesia Physical Anesthesia Plan  ASA:   Anesthesia Plan:    Post-op Pain Management:    Induction:   PONV Risk Score and Plan:   Airway Management Planned:   Additional Equipment:   Intra-op Plan:   Post-operative Plan:   Informed Consent:   Plan Discussed with:   Anesthesia Plan Comments: (As patient is pregnant with a viable 28 week fetus and this is a procedure in which intra operative fetal monitoring can be performed we feel that the most aporriate care for the patient and her fetus would include intra operative fetal monitoring and having an obstetric surgical team immediately available for stat C section if that is required.  This information was communicated to the OR team.)        Anesthesia Quick Evaluation

## 2018-01-29 ENCOUNTER — Encounter
Admission: EM | Disposition: A | Discharge status: Home / Self Care | Payer: Self-pay | Source: Home / Self Care | Attending: Obstetrics and Gynecology

## 2018-01-29 DIAGNOSIS — O26613 Liver and biliary tract disorders in pregnancy, third trimester: Secondary | ICD-10-CM

## 2018-01-29 DIAGNOSIS — N1330 Unspecified hydronephrosis: Secondary | ICD-10-CM

## 2018-01-29 DIAGNOSIS — N200 Calculus of kidney: Secondary | ICD-10-CM

## 2018-01-29 DIAGNOSIS — R112 Nausea with vomiting, unspecified: Secondary | ICD-10-CM

## 2018-01-29 DIAGNOSIS — R1032 Left lower quadrant pain: Secondary | ICD-10-CM

## 2018-01-29 DIAGNOSIS — Z3A28 28 weeks gestation of pregnancy: Secondary | ICD-10-CM

## 2018-01-29 DIAGNOSIS — K7689 Other specified diseases of liver: Secondary | ICD-10-CM | POA: Diagnosis present

## 2018-01-29 DIAGNOSIS — O26833 Pregnancy related renal disease, third trimester: Secondary | ICD-10-CM

## 2018-01-29 DIAGNOSIS — N132 Hydronephrosis with renal and ureteral calculous obstruction: Secondary | ICD-10-CM

## 2018-01-29 DIAGNOSIS — R160 Hepatomegaly, not elsewhere classified: Secondary | ICD-10-CM

## 2018-01-29 SURGERY — CYSTOSCOPY, WITH STENT INSERTION
Anesthesia: Choice | Laterality: Left

## 2018-01-29 MED ORDER — ONDANSETRON 4 MG PO TBDP
8.0000 mg | ORAL_TABLET | Freq: Three times a day (TID) | ORAL | Status: DC | PRN
  Administered 2018-01-29: 8 mg via ORAL
  Filled 2018-01-29: qty 2

## 2018-01-29 MED ORDER — ACETAMINOPHEN 325 MG PO TABS: 650.0000 mg | ORAL_TABLET | Freq: Four times a day (QID) | ORAL | Status: DC | PRN

## 2018-01-29 MED ORDER — TAMSULOSIN HCL 0.4 MG PO CAPS: 0.4000 mg | ORAL_CAPSULE | Freq: Every day | ORAL | 0 refills | Status: DC

## 2018-01-29 MED ORDER — OXYCODONE HCL 5 MG PO TABS: 10.0000 mg | ORAL_TABLET | Freq: Four times a day (QID) | ORAL | 0 refills | Status: DC | PRN

## 2018-01-29 MED ORDER — ONDANSETRON 8 MG PO TBDP: 8.0000 mg | ORAL_TABLET | Freq: Three times a day (TID) | ORAL | 0 refills | Status: DC | PRN

## 2018-01-29 NOTE — Plan of Care (Signed)
Vs stable; up ad lib; tolerating regular diet; pain more tolerable this shift

## 2018-01-29 NOTE — Discharge Summary (Addendum)
DC Summary Discharge Summary   Patient ID: Lindsay Parsons [id number] 32 y.o. [birth year]  Admit date: 01/27/2018  Discharge date: 01/29/2018  Principal Diagnoses:  1) intrauterine pregnancy at [redacted]w[redacted]d  2) left flank pain 3) left ureterolithiasis 4) hepatic cyst   Secondary Diagnoses:  1) intrauterine pregnancy at [redacted]w[redacted]d  2) left flank pain 3) left ureterolithiasis 4) hepatic cyst   Procedures performed during the hospitalization:  Renal and abdominal ultrasound Pain control Urology consult  HPI: Lindsay Parsons is a 32yo G2P1 28w 6d with no significant past medical history who presented to the ER on 3/21 with severe left flank pain. The pain started that afternoon and is sharp, severe, constant, nonradiating left flank pain with associated nausea and vomiting. This is her first stone event. Renal US obtained in the ER showed a left 49mm lower pole calculus and mild left hydronephrosis. He has new urinary urgency and frequency but no dysuria of gross hematuria. Pain is poorly controlled on prn dilaudid and oxycodone but the patient has missed multiple doses to to concern of taking narcotics when pregnant. Father has hx of nephrolithiasis  Past Medical History:  Diagnosis Date  . SVT (supraventricular tachycardia) (HCC)     Past Surgical History:  Procedure Laterality Date  . FOOT SURGERY  2007   cyst removed from left foot  . TONSILLECTOMY  1989    Allergies  Allergen Reactions  . Cephalosporins Hives    Social History   Tobacco Use  . Smoking status: Never Smoker  . Smokeless tobacco: Never Used  Substance Use Topics  . Alcohol use: No  . Drug use: No    Family History  Problem Relation Age of Onset  . Cancer Father   . Hyperlipidemia Father   . Cancer Maternal Grandmother   . Cancer Maternal Grandfather   . Cancer Paternal Grandfather   . Hypertension Paternal [facility] Course:  Admitted with pain. Urology consult.  She was started on  flomax with the option for ureteral stent on HD#3.  On HD#3 she had a significant improvement in her pain and decided against a stent procedure.  Urology recommendation was to contiue flomax and take prn pain medication until she passed her stone. An incidental finding of a 5 cm right lobe hepatic cyst was noted.  This was not further investigated. However, will need possible MRI evaluation as an outpatient. She was meeting discharge criteria of tolerating PO, having adequate pain control, voiding, and was ambulatory.  She noted good fetal movement, no contractions, no vaginal bleeding, and no leakage of fluid.  She was, therefore, deemed to be a appropriate candidate for discharge.  Discharge Exam: BP (!) 112/54 (BP Location: Right Arm) Comment: recheck  Pulse 84   Temp 97.6 F (36.4 C) (Oral)   Resp 18   Ht 5\' 2"  (1.575 m)   Wt 190 lb (86.2 kg)   SpO2 99%   BMI 34.75 kg/m  General  no apparent distress   CV  RRR, II/VI SEM   Pulmonary  clear to ausculatation bllaterally   Abdomen  +BS, gravid, NT, no CVAT  Extremities  no edema, symmetric, SCDs in place    Condition at Discharge: Stable  Complications affecting treatment: None  Discharge Medications:  Allergies as of 01/29/2018      Reactions   Cephalosporins Hives      Medication List    TAKE these medications   acetaminophen 325 MG tablet Commonly known as:  TYLENOL  Take 2 tablets (650 mg total) by mouth every 6 (six) hours as needed for mild pain (or Fever >/= 101).   ondansetron 8 MG disintegrating tablet Commonly known as:  ZOFRAN-ODT Take 1 tablet (8 mg total) by mouth every 8 (eight) hours as needed for nausea or vomiting.   oxyCODONE 5 MG immediate release tablet Commonly known as:  Oxy IR/ROXICODONE Take 2 tablets (10 mg total) by mouth every 6 (six) hours as needed for breakthrough pain.   prenatal multivitamin Tabs tablet Take 1 tablet by mouth at bedtime.   tamsulosin 0.4 MG Caps capsule Commonly known as:   FLOMAX Take 1 capsule (0.4 mg total) by mouth daily after supper.       Follow-up arrangements:  Follow-up Information    [facility] Follow up in 4 day(s).   Why:  Follow up ureteral stone management       Sherlyn Hay, DO Follow up in 1 week(s).   Specialty:  Obstetrics and Gynecology Why:  Follow up hospitalization, follow up hepatic lesion Contact information: [address] [place] [address] [phone]          Discharge Disposition: Home to self care  Signed: Prentice Docker, MD 01/29/2018 12:18 PM

## 2018-01-29 NOTE — Progress Notes (Signed)
Discharge instructions complete and prescriptions sent to pharmacy by provider. Patient verbalizes understanding of teaching. Patient offered wheelchair 3 times. Patient wants to walk to car for discharge. Patient discharged at 12:35.

## 2018-01-29 NOTE — Progress Notes (Signed)
Patient doing well this morning. Pain rating between 1 and 2/10. Patient taking PRN Tylenol and Roxicodone. Patient has not passed kidney stone as of now but states she "feels like it will pass soon."  OB in department to discharge patient. RN spoke with Dr. Alyson Ingles in urology about patient wanting to discharge. Urology ok with patient being discharged; states that patient needs to follow up this week with [facility] (patient to call to make that appointment), and that patient should be sent with prescriptions for Flomax, Tylenol, and Roxicodone. RN gave this information to rounding OB, Dr. Glennon Mac.

## 2018-01-31 ENCOUNTER — Encounter: Payer: Self-pay | Admitting: Urology

## 2018-01-31 ENCOUNTER — Ambulatory Visit (INDEPENDENT_AMBULATORY_CARE_PROVIDER_SITE_OTHER): Payer: No Typology Code available for payment source | Admitting: Urology

## 2018-01-31 VITALS — BP 108/65 | HR 94

## 2018-01-31 DIAGNOSIS — N200 Calculus of kidney: Secondary | ICD-10-CM

## 2018-01-31 LAB — URINALYSIS, COMPLETE
Bilirubin, UA: NEGATIVE
Glucose, UA: NEGATIVE
Ketones, UA: NEGATIVE
NITRITE UA: NEGATIVE
PH UA: 7 (ref 5.0–7.5)
Protein, UA: NEGATIVE
Specific Gravity, UA: 1.02 (ref 1.005–1.030)
UUROB: 0.2 mg/dL (ref 0.2–1.0)

## 2018-01-31 LAB — MICROSCOPIC EXAMINATION

## 2018-01-31 NOTE — Progress Notes (Signed)
01/31/2018 3:04 PM   Sherron Flemings 03/11/1986 [id number]  Referring provider: No referring provider defined for this encounter.  Chief Complaint  Patient presents with  . Nephrolithiasis    New Patient    HPI: Lindsay Parsons is a 32 year old female with IUP at 29 weeks who was admitted over the weekend with severe left flank pain associated with nausea and vomiting.  Renal ultrasound was remarkable for a probable 8 mm left lower pole calculus and mild left hydronephrosis.  She was seen while hospitalized by Dr. Alyson Ingles and options were discussed including medical expulsion therapy, stent placement and percutaneous nephrostomy.  Her pain significantly improved and she was discharged yesterday with instructions of urologic follow-up.  She states last night she had onset of increased urgency every 20-30 minutes with voiding small amounts and a tingling sensation in her urethra.  Her pain subsequently resolved and she is presently asymptomatic.  She has no complaints.   PMH: Past Medical History:  Diagnosis Date  . SVT (supraventricular tachycardia) [facility])     Surgical History: Past Surgical History:  Procedure Laterality Date  . FOOT SURGERY  2007   cyst removed from left foot  . TONSILLECTOMY  1989    Home Medications:  Allergies as of 01/31/2018      Reactions   Cephalosporins Hives      Medication List        Accurate as of 01/31/18  3:04 PM. Always use your most recent med list.          acetaminophen 325 MG tablet Commonly known as:  TYLENOL Take 2 tablets (650 mg total) by mouth every 6 (six) hours as needed for mild pain (or Fever >/= 101).   ondansetron 8 MG disintegrating tablet Commonly known as:  ZOFRAN-ODT Take 1 tablet (8 mg total) by mouth every 8 (eight) hours as needed for nausea or vomiting.   oxyCODONE 5 MG immediate release tablet Commonly known as:  Oxy IR/ROXICODONE Take 2 tablets (10 mg total) by mouth every 6 (six) hours as needed for  breakthrough pain.   prenatal multivitamin Tabs tablet Take 1 tablet by mouth at bedtime.   tamsulosin 0.4 MG Caps capsule Commonly known as:  FLOMAX Take 1 capsule (0.4 mg total) by mouth daily after supper.       Allergies:  Allergies  Allergen Reactions  . Cephalosporins Hives    Family History: Family History  Problem Relation Age of Onset  . Cancer Father   . Hyperlipidemia Father   . Cancer Maternal Grandmother   . Cancer Maternal Grandfather   . Cancer Paternal Grandfather   . Hypertension Paternal Grandfather     Social History:  reports that she has never smoked. She has never used smokeless tobacco. She reports that she does not drink alcohol or use drugs.  ROS: UROLOGY Frequent Urination?: No Hard to postpone urination?: No Burning/pain with urination?: No Get up at night to urinate?: Yes Leakage of urine?: No Urine stream starts and stops?: No Trouble starting stream?: No Do you have to strain to urinate?: No Blood in urine?: No Urinary tract infection?: No Sexually transmitted disease?: No Injury to kidneys or bladder?: No Painful intercourse?: No Weak stream?: No Currently pregnant?: No Vaginal bleeding?: No Last menstrual period?: pregnant  Gastrointestinal Nausea?: Yes Vomiting?: Yes Indigestion/heartburn?: No Diarrhea?: No Constipation?: Yes  Constitutional Fever: No Night sweats?: No Weight loss?: No Fatigue?: No  Skin Skin rash/lesions?: No Itching?: No  Eyes Blurred vision?: No Double  vision?: No  Ears/Nose/Throat Sore throat?: No Sinus problems?: No  Hematologic/Lymphatic Swollen glands?: No Easy bruising?: No  Cardiovascular Leg swelling?: No Chest pain?: No  Respiratory Cough?: No Shortness of breath?: No  Endocrine Excessive thirst?: No  Musculoskeletal Back pain?: Yes Joint pain?: No  Neurological Headaches?: Yes Dizziness?: No  Psychologic Depression?: No Anxiety?: No  Physical Exam: BP  108/65   Pulse 94   Constitutional:  Alert and oriented, No acute distress.   Laboratory Data: Lab Results  Component Value Date   WBC 14.2 (H) 01/27/2018   HGB 13.3 01/27/2018   HCT 39.0 01/27/2018   MCV 93.4 01/27/2018   PLT 266 01/27/2018    Lab Results  Component Value Date   CREATININE 1.07 (H) 01/28/2018    Urinalysis UA today 11-30 RBCs however there are greater than 30 epithelial cells    Assessment & Plan:   Her pain has resolved and clinical presentation suspicious for a recently passed stone.  I have recommended a follow-up KUB and renal ultrasound after she delivers.  She was instructed to call earlier should she develop recurrent flank pain.   Return in about 3 months (around 05/03/2018) for [place], [place].    Abbie Sons, [place] [address], [place], [place] [address] [phone]

## 2018-02-14 ENCOUNTER — Other Ambulatory Visit: Payer: Self-pay | Admitting: Family Medicine

## 2018-02-14 DIAGNOSIS — R160 Hepatomegaly, not elsewhere classified: Secondary | ICD-10-CM

## 2018-03-24 LAB — OB RESULTS CONSOLE GBS: STREP GROUP B AG: POSITIVE

## 2018-04-01 ENCOUNTER — Telehealth: Payer: Self-pay | Admitting: Urology

## 2018-04-01 DIAGNOSIS — N200 Calculus of kidney: Secondary | ICD-10-CM

## 2018-04-01 NOTE — Telephone Encounter (Signed)
Orders were entered.

## 2018-04-01 NOTE — Addendum Note (Signed)
Addended by: John Giovanni C on: 04/01/2018 11:55 AM   Modules accepted: Orders

## 2018-04-01 NOTE — Telephone Encounter (Signed)
Pt called asking about her RUS and KUB, states she has not had a call from scheduling to schedule her RUS, there are no orders for either. Pt also states she make be induced a week early and would like to get RUS scheduled 1 week before her appt.  Please advise. Thanks.

## 2018-04-08 ENCOUNTER — Encounter (HOSPITAL_COMMUNITY): Payer: Self-pay | Admitting: *Deleted

## 2018-04-08 ENCOUNTER — Telehealth (HOSPITAL_COMMUNITY): Payer: Self-pay | Admitting: *Deleted

## 2018-04-08 NOTE — Telephone Encounter (Signed)
Preadmission screen  

## 2018-04-10 ENCOUNTER — Encounter: Payer: Self-pay | Admitting: Urology

## 2018-04-11 NOTE — H&P (Signed)
Lindsay Parsons is a 32 y.o. G59P1001 female presenting at 47 3/7wks for elective iol at term. Pt has suffered from pain due to sciatic nerve- declined physical therapy - in pregnancy. She passed a kidney stone at [redacted] weeks gestation. Recently has had persistent.  coughing/wheezing- saw PCP. She declined essential panel and first trimester screen. She is GBS positive  OB History    Gravida  2   Para  1   Term  1   Preterm      AB      Living  1     SAB      TAB      Ectopic      Multiple  0   Live Births  1          Past Medical History:  Diagnosis Date  . Bulging of cervical intervertebral disc   . Kidney stones   . SVT (supraventricular tachycardia) (HCC)    Past Surgical History:  Procedure Laterality Date  . FOOT SURGERY  2007   cyst removed from left foot  . TONSILLECTOMY  1989   Family History: family history includes Cancer in her father, maternal grandfather, maternal grandmother, and paternal grandfather; Hyperlipidemia in her father; Hypertension in her paternal grandfather. Social History:  reports that she has never smoked. She has never used smokeless tobacco. She reports that she does not drink alcohol or use drugs.     Maternal Diabetes: No Genetic Screening: Declined Maternal Ultrasounds/Referrals: Normal Fetal Ultrasounds or other Referrals:  None Maternal Substance Abuse:  No Significant Maternal Medications:  None Significant Maternal Lab Results:  Lab values include: Group B Strep positive Other Comments:  None  Review of Systems  Constitutional: Positive for malaise/fatigue. Negative for chills, fever and weight loss.  Eyes: Negative for blurred vision and double vision.  Respiratory: Positive for cough, hemoptysis, shortness of breath and wheezing.   Cardiovascular: Negative for chest pain and leg swelling.  Gastrointestinal: Positive for abdominal pain. Negative for heartburn, nausea and vomiting.  Genitourinary: Negative for  dysuria.  Musculoskeletal: Positive for myalgias.  Skin: Negative for itching and rash.  Neurological: Negative for dizziness.  Endo/Heme/Allergies: Negative for environmental allergies. Does not bruise/bleed easily.  Psychiatric/Behavioral: Negative for depression, hallucinations, substance abuse and suicidal ideas. The patient is not nervous/anxious.    Maternal Medical History:  Reason for admission: Nausea.      Last menstrual period 06/29/2017, unknown if currently breastfeeding. Exam Physical Exam  Prenatal labs: ABO, Rh: A/Positive/-- (10/29 0000) Antibody: Negative (10/29 0000) Rubella: Immune (10/29 0000) RPR: Nonreactive (10/29 0000)  HBsAg: Negative (10/29 0000)  HIV: Non-reactive (10/29 0000)  GBS: Positive (05/16 0000)   Assessment/Plan: G2P1001 at 45 3/7wks for elective iol Plan to induce with pitocin then AROM when able PCN for GBS treatment Anticipate svd   Venetia Night Jerimie Mancuso 04/11/2018, 5:46 PM

## 2018-04-12 ENCOUNTER — Inpatient Hospital Stay (HOSPITAL_COMMUNITY): Payer: No Typology Code available for payment source | Admitting: Anesthesiology

## 2018-04-12 ENCOUNTER — Encounter (HOSPITAL_COMMUNITY): Payer: Self-pay

## 2018-04-12 ENCOUNTER — Inpatient Hospital Stay (HOSPITAL_COMMUNITY)
Admission: RE | Admit: 2018-04-12 | Discharge: 2018-04-14 | DRG: 807 | Disposition: A | Discharge status: Home / Self Care | Payer: No Typology Code available for payment source | Attending: Obstetrics and Gynecology | Admitting: Obstetrics and Gynecology

## 2018-04-12 DIAGNOSIS — O99824 Streptococcus B carrier state complicating childbirth: Secondary | ICD-10-CM | POA: Diagnosis present

## 2018-04-12 DIAGNOSIS — Z3A39 39 weeks gestation of pregnancy: Secondary | ICD-10-CM | POA: Diagnosis not present

## 2018-04-12 DIAGNOSIS — O99214 Obesity complicating childbirth: Secondary | ICD-10-CM | POA: Diagnosis present

## 2018-04-12 DIAGNOSIS — Z3490 Encounter for supervision of normal pregnancy, unspecified, unspecified trimester: Secondary | ICD-10-CM

## 2018-04-12 DIAGNOSIS — Z3483 Encounter for supervision of other normal pregnancy, third trimester: Secondary | ICD-10-CM | POA: Diagnosis present

## 2018-04-12 LAB — CBC
HCT: 36.2 % (ref 36.0–46.0)
HEMOGLOBIN: 12.8 g/dL (ref 12.0–15.0)
MCH: 32.2 pg (ref 26.0–34.0)
MCHC: 35.4 g/dL (ref 30.0–36.0)
MCV: 91 fL (ref 78.0–100.0)
Platelets: 225 10*3/uL (ref 150–400)
RBC: 3.98 MIL/uL (ref 3.87–5.11)
RDW: 13.7 % (ref 11.5–15.5)
WBC: 9.3 10*3/uL (ref 4.0–10.5)

## 2018-04-12 LAB — TYPE AND SCREEN
ABO/RH(D): A POS
Antibody Screen: NEGATIVE

## 2018-04-12 LAB — RPR: RPR: NONREACTIVE

## 2018-04-12 MED ORDER — COCONUT OIL OIL
1.0000 | TOPICAL_OIL | Status: DC | PRN
Start: 2018-04-12 — End: 2018-04-14

## 2018-04-12 MED ORDER — EPHEDRINE 5 MG/ML INJ
10.0000 mg | INTRAVENOUS | Status: DC | PRN
  Filled 2018-04-12: qty 2

## 2018-04-12 MED ORDER — PHENYLEPHRINE 40 MCG/ML (10ML) SYRINGE FOR IV PUSH (FOR BLOOD PRESSURE SUPPORT)
80.0000 ug | PREFILLED_SYRINGE | INTRAVENOUS | Status: DC | PRN
  Filled 2018-04-12: qty 5

## 2018-04-12 MED ORDER — ONDANSETRON HCL 4 MG PO TABS: 4.0000 mg | ORAL_TABLET | ORAL | Status: DC | PRN

## 2018-04-12 MED ORDER — BENZOCAINE-MENTHOL 20-0.5 % EX AERO: 1.0000 "application " | INHALATION_SPRAY | CUTANEOUS | Status: DC | PRN

## 2018-04-12 MED ORDER — PHENYLEPHRINE 40 MCG/ML (10ML) SYRINGE FOR IV PUSH (FOR BLOOD PRESSURE SUPPORT): 80.0000 ug | PREFILLED_SYRINGE | INTRAVENOUS | Status: DC | PRN

## 2018-04-12 MED ORDER — PENICILLIN G POT IN DEXTROSE 60000 UNIT/ML IV SOLN
3.0000 10*6.[IU] | INTRAVENOUS | Status: DC
  Administered 2018-04-12 (×2): 3 10*6.[IU] via INTRAVENOUS
  Filled 2018-04-12 (×4): qty 50

## 2018-04-12 MED ORDER — SIMETHICONE 80 MG PO CHEW: 80.0000 mg | CHEWABLE_TABLET | ORAL | Status: DC | PRN

## 2018-04-12 MED ORDER — OXYCODONE-ACETAMINOPHEN 5-325 MG PO TABS: 1.0000 | ORAL_TABLET | ORAL | Status: DC | PRN

## 2018-04-12 MED ORDER — ZOLPIDEM TARTRATE 5 MG PO TABS: 5.0000 mg | ORAL_TABLET | Freq: Every evening | ORAL | Status: DC | PRN

## 2018-04-12 MED ORDER — WITCH HAZEL-GLYCERIN EX PADS
1.0000 "application " | MEDICATED_PAD | CUTANEOUS | Status: DC | PRN
  Administered 2018-04-13: 1 via TOPICAL

## 2018-04-12 MED ORDER — LACTATED RINGERS IV SOLN: 500.0000 mL | Freq: Once | INTRAVENOUS | Status: DC

## 2018-04-12 MED ORDER — DIPHENHYDRAMINE HCL 50 MG/ML IJ SOLN: 12.5000 mg | INTRAMUSCULAR | Status: DC | PRN

## 2018-04-12 MED ORDER — FENTANYL CITRATE (PF) 100 MCG/2ML IJ SOLN
100.0000 ug | Freq: Once | INTRAMUSCULAR | Status: AC
  Administered 2018-04-12: 100 ug via INTRAVENOUS

## 2018-04-12 MED ORDER — ACETAMINOPHEN 325 MG PO TABS: 650.0000 mg | ORAL_TABLET | ORAL | Status: DC | PRN

## 2018-04-12 MED ORDER — PHENYLEPHRINE 40 MCG/ML (10ML) SYRINGE FOR IV PUSH (FOR BLOOD PRESSURE SUPPORT)
80.0000 ug | PREFILLED_SYRINGE | INTRAVENOUS | Status: DC | PRN
  Filled 2018-04-12: qty 10
  Filled 2018-04-12: qty 5

## 2018-04-12 MED ORDER — ONDANSETRON HCL 4 MG/2ML IJ SOLN: 4.0000 mg | INTRAMUSCULAR | Status: DC | PRN

## 2018-04-12 MED ORDER — FENTANYL CITRATE (PF) 100 MCG/2ML IJ SOLN
INTRAMUSCULAR | Status: AC
  Filled 2018-04-12: qty 2

## 2018-04-12 MED ORDER — TERBUTALINE SULFATE 1 MG/ML IJ SOLN
0.2500 mg | Freq: Once | INTRAMUSCULAR | Status: DC | PRN
  Filled 2018-04-12: qty 1

## 2018-04-12 MED ORDER — OXYCODONE HCL 5 MG PO TABS
5.0000 mg | ORAL_TABLET | ORAL | Status: DC | PRN
  Filled 2018-04-12: qty 1

## 2018-04-12 MED ORDER — SODIUM CHLORIDE 0.9 % IV SOLN
5.0000 10*6.[IU] | Freq: Once | INTRAVENOUS | Status: AC
  Administered 2018-04-12: 5 10*6.[IU] via INTRAVENOUS
  Filled 2018-04-12: qty 5

## 2018-04-12 MED ORDER — LACTATED RINGERS IV SOLN
INTRAVENOUS | Status: DC
  Administered 2018-04-12 (×2): via INTRAVENOUS

## 2018-04-12 MED ORDER — SOD CITRATE-CITRIC ACID 500-334 MG/5ML PO SOLN: 30.0000 mL | ORAL | Status: DC | PRN

## 2018-04-12 MED ORDER — DIBUCAINE 1 % RE OINT
1.0000 "application " | TOPICAL_OINTMENT | RECTAL | Status: DC | PRN
  Administered 2018-04-14: 1 via RECTAL
  Filled 2018-04-12: qty 28

## 2018-04-12 MED ORDER — LACTATED RINGERS IV SOLN: 500.0000 mL | INTRAVENOUS | Status: DC | PRN

## 2018-04-12 MED ORDER — FENTANYL 2.5 MCG/ML BUPIVACAINE 1/10 % EPIDURAL INFUSION (WH - ANES)
14.0000 mL/h | INTRAMUSCULAR | Status: DC | PRN
  Administered 2018-04-12: 14 mL/h via EPIDURAL
  Filled 2018-04-12: qty 100

## 2018-04-12 MED ORDER — OXYCODONE-ACETAMINOPHEN 5-325 MG PO TABS: 2.0000 | ORAL_TABLET | ORAL | Status: DC | PRN

## 2018-04-12 MED ORDER — TAMSULOSIN HCL 0.4 MG PO CAPS
0.4000 mg | ORAL_CAPSULE | Freq: Every day | ORAL | Status: DC
  Filled 2018-04-12 (×2): qty 1

## 2018-04-12 MED ORDER — OXYCODONE HCL 5 MG PO TABS: 10.0000 mg | ORAL_TABLET | ORAL | Status: DC | PRN

## 2018-04-12 MED ORDER — DIPHENHYDRAMINE HCL 25 MG PO CAPS: 25.0000 mg | ORAL_CAPSULE | Freq: Four times a day (QID) | ORAL | Status: DC | PRN

## 2018-04-12 MED ORDER — TETANUS-DIPHTH-ACELL PERTUSSIS 5-2.5-18.5 LF-MCG/0.5 IM SUSP: 0.5000 mL | Freq: Once | INTRAMUSCULAR | Status: DC

## 2018-04-12 MED ORDER — OXYTOCIN 40 UNITS IN LACTATED RINGERS INFUSION - SIMPLE MED
1.0000 m[IU]/min | INTRAVENOUS | Status: DC
  Administered 2018-04-12: 2 m[IU]/min via INTRAVENOUS
  Filled 2018-04-12: qty 1000

## 2018-04-12 MED ORDER — OXYTOCIN 40 UNITS IN LACTATED RINGERS INFUSION - SIMPLE MED: 2.5000 [IU]/h | INTRAVENOUS | Status: DC

## 2018-04-12 MED ORDER — ONDANSETRON HCL 4 MG/2ML IJ SOLN
4.0000 mg | Freq: Four times a day (QID) | INTRAMUSCULAR | Status: DC | PRN
  Administered 2018-04-12: 4 mg via INTRAVENOUS
  Filled 2018-04-12: qty 2

## 2018-04-12 MED ORDER — EPHEDRINE 5 MG/ML INJ: 10.0000 mg | INTRAVENOUS | Status: DC | PRN

## 2018-04-12 MED ORDER — SENNOSIDES-DOCUSATE SODIUM 8.6-50 MG PO TABS
2.0000 | ORAL_TABLET | ORAL | Status: DC
  Administered 2018-04-12 – 2018-04-14 (×2): 2 via ORAL
  Filled 2018-04-12 (×2): qty 2

## 2018-04-12 MED ORDER — LACTATED RINGERS IV SOLN
500.0000 mL | Freq: Once | INTRAVENOUS | Status: AC
  Administered 2018-04-12: 500 mL via INTRAVENOUS

## 2018-04-12 MED ORDER — ACETAMINOPHEN 325 MG PO TABS
650.0000 mg | ORAL_TABLET | ORAL | Status: DC | PRN
Start: 2018-04-12 — End: 2018-04-14
  Administered 2018-04-13 (×2): 650 mg via ORAL
  Filled 2018-04-12 (×2): qty 2

## 2018-04-12 MED ORDER — IBUPROFEN 600 MG PO TABS
600.0000 mg | ORAL_TABLET | Freq: Four times a day (QID) | ORAL | Status: DC
  Administered 2018-04-12 – 2018-04-14 (×7): 600 mg via ORAL
  Filled 2018-04-12 (×7): qty 1

## 2018-04-12 MED ORDER — OXYTOCIN BOLUS FROM INFUSION
500.0000 mL | Freq: Once | INTRAVENOUS | Status: AC
  Administered 2018-04-12: 500 mL via INTRAVENOUS

## 2018-04-12 MED ORDER — PRENATAL MULTIVITAMIN CH
1.0000 | ORAL_TABLET | Freq: Every day | ORAL | Status: DC
  Administered 2018-04-13 – 2018-04-14 (×2): 1 via ORAL
  Filled 2018-04-12 (×2): qty 1

## 2018-04-12 MED ORDER — LIDOCAINE HCL (PF) 1 % IJ SOLN
30.0000 mL | INTRAMUSCULAR | Status: DC | PRN
  Filled 2018-04-12: qty 30

## 2018-04-12 MED ORDER — LIDOCAINE HCL (PF) 1 % IJ SOLN
INTRAMUSCULAR | Status: DC | PRN
  Administered 2018-04-12: 6 mL via EPIDURAL

## 2018-04-12 MED ORDER — AMPICILLIN SODIUM 2 G IJ SOLR
2.0000 g | Freq: Once | INTRAMUSCULAR | Status: DC
  Filled 2018-04-12: qty 2000

## 2018-04-12 NOTE — Progress Notes (Signed)
Patient ID: Lindsay Parsons, female   DOB: [birth year], 32 y.o.   MRN: [id number] Pt doing well with no complaints. Ctxs not too painful. +Fms VSS EFM - cat 1, 150 TOCO - ctxs q 1-48mins SVE - 3/80/-2  A/P: G2P1001 at term progressing on pit         AROM performed with moderate clear fluid         Expectant mgmt

## 2018-04-12 NOTE — Anesthesia Pain Management Evaluation Note (Signed)
  CRNA Pain Management Visit Note  Patient: Lindsay Parsons, 32 y.o., female  "Hello I am a member of the anesthesia team at [facility]. We have an anesthesia team available at all times to provide care throughout the hospital, including epidural management and anesthesia for C-section. I don't know your plan for the delivery whether it a natural birth, water birth, IV sedation, nitrous supplementation, doula or epidural, but we want to meet your pain goals."   1.Was your pain managed to your expectations on prior hospitalizations?   Yes   2.What is your expectation for pain management during this hospitalization?     Epidural  3.How can we help you reach that goal? Epidyral when desired.  Record the patient's initial score and the patient's pain goal.   Pain: 4  Pain Goal: 7 [facility] wants you to be able to say your pain was always managed very well.  Lacie Landry 04/12/2018

## 2018-04-12 NOTE — Progress Notes (Signed)
Patient ID: Lindsay Parsons, female   DOB: 16-Feb-1986, 32 y.o.   MRN: [id number] Pt comfortable. +Fms VSS 5.5/90/-2 Ctxs noted  A/P: Progressing in labor         Continue current mgmt

## 2018-04-12 NOTE — Progress Notes (Signed)
Patient ID: Lindsay Parsons, female   DOB: February 16, 1986, 32 y.o.   MRN: [id number] Pt doing well with no complaints. Tolerating pitocin - contractions mild. No LOF or VB. +Fms VSS EFM - cat 1 TOCO - contractions q 5 mins SVE - checked by nurse 102mins ago 2.5/50/-3  A/P: Continue pitocin per protocol at this time         AROM in 2-3 hours if able         PCN for GBS treatment         Anticipate svd

## 2018-04-12 NOTE — Anesthesia Preprocedure Evaluation (Signed)
Anesthesia Evaluation  Patient identified by MRN, date of birth, ID band  Reviewed: Allergy & Precautions, NPO status , Patient's Chart, lab work & pertinent test results  Airway Mallampati: II  TM Distance: >3 FB Neck ROM: Full    Dental no notable dental hx. (+) Teeth Intact, Dental Advisory Given   Pulmonary neg pulmonary ROS,    Pulmonary exam normal breath sounds clear to auscultation       Cardiovascular Exercise Tolerance: Good negative cardio ROS Normal cardiovascular exam Rhythm:Regular Rate:Normal     Neuro/Psych negative neurological ROS     GI/Hepatic Neg liver ROS,   Endo/Other  Morbid obesity  Renal/GU   negative genitourinary   Musculoskeletal   Abdominal (+) + obese,   Peds  Hematology negative hematology ROS (+)   Anesthesia Other Findings   Reproductive/Obstetrics (+) Pregnancy                             Lab Results  Component Value Date   WBC 9.3 04/12/2018   HGB 12.8 04/12/2018   HCT 36.2 04/12/2018   MCV 91.0 04/12/2018   PLT 225 04/12/2018    Anesthesia Physical Anesthesia Plan  ASA: III  Anesthesia Plan: Epidural   Post-op Pain Management:    Induction:   PONV Risk Score and Plan:   Airway Management Planned:   Additional Equipment:   Intra-op Plan:   Post-operative Plan:   Informed Consent: I have reviewed the patients History and Physical, chart, labs and discussed the procedure including the risks, benefits and alternatives for the proposed anesthesia with the patient or authorized representative who has indicated his/her understanding and acceptance.     Plan Discussed with:   Anesthesia Plan Comments:         Anesthesia Quick Evaluation

## 2018-04-12 NOTE — Anesthesia Procedure Notes (Signed)
Epidural Patient location during procedure: OB Start time: 04/12/2018 12:45 PM End time: 04/12/2018 12:58 PM  Staffing Anesthesiologist: Barnet Glasgow, MD Performed: anesthesiologist   Preanesthetic Checklist Completed: patient identified, site marked, surgical consent, pre-op evaluation, timeout performed, IV checked, risks and benefits discussed and monitors and equipment checked  Epidural Patient position: sitting Prep: site prepped and draped and DuraPrep Patient monitoring: continuous pulse ox and blood pressure Approach: midline Location: L3-L4 Injection technique: LOR air  Needle:  Needle type: Tuohy  Needle gauge: 17 G Needle length: 9 cm and 9 Needle insertion depth: 5 cm cm Catheter type: closed end flexible Catheter size: 19 Gauge Catheter at skin depth: 10 cm Test dose: negative  Assessment Sensory level: T4 Events: blood not aspirated, injection not painful, no injection resistance, negative IV test and no paresthesia

## 2018-04-12 NOTE — Progress Notes (Signed)
Patient ID: Lindsay Parsons, female   DOB: 09/10/1986, 32 y.o.   MRN: [id number] Ant lip Labor down  Recheck prn and start pushing if complete

## 2018-04-13 LAB — CBC
HEMATOCRIT: 32.1 % — AB (ref 36.0–46.0)
Hemoglobin: 11.2 g/dL — ABNORMAL LOW (ref 12.0–15.0)
MCH: 32 pg (ref 26.0–34.0)
MCHC: 34.9 g/dL (ref 30.0–36.0)
MCV: 91.7 fL (ref 78.0–100.0)
Platelets: 180 10*3/uL (ref 150–400)
RBC: 3.5 MIL/uL — ABNORMAL LOW (ref 3.87–5.11)
RDW: 13.7 % (ref 11.5–15.5)
WBC: 11.7 10*3/uL — ABNORMAL HIGH (ref 4.0–10.5)

## 2018-04-13 NOTE — Anesthesia Postprocedure Evaluation (Signed)
Anesthesia Post Note  Patient: Lindsay Parsons  Procedure(s) Performed: AN AD [place]     Patient location during evaluation: Mother Baby Anesthesia Type: Epidural Level of consciousness: awake and alert Pain management: pain level controlled Vital Signs Assessment: post-procedure vital signs reviewed and stable Respiratory status: spontaneous breathing, nonlabored ventilation and respiratory function stable Cardiovascular status: stable Postop Assessment: no headache, no backache and epidural receding Anesthetic complications: no    Last Vitals:  Vitals:   04/13/18 0200 04/13/18 0524  BP: 122/77 111/68  Pulse: 71 80  Resp: 18 18  Temp: 36.7 C 36.8 C  SpO2: 99% 99%    Last Pain:  Vitals:   04/13/18 0728  TempSrc:   PainSc: 7    Pain Goal:                 Riki Sheer

## 2018-04-13 NOTE — Progress Notes (Signed)
Post Partum Day 1 Subjective: up ad lib and tolerating PO  Sore and some cramping but coping ok  Objective: Blood pressure 121/74, pulse 81, temperature 98 F (36.7 C), temperature source Oral, resp. rate 18, height 5\' 2"  (1.575 m), weight 91.6 kg (202 lb), last menstrual period 06/29/2017, SpO2 99 %, unknown if currently breastfeeding.  Physical Exam:  General: alert and cooperative Lochia: appropriate Uterine Fundus: firm   Recent Labs    04/12/18 0757 04/13/18 0628  HGB 12.8 11.2*  HCT 36.2 32.1*    Assessment/Plan: Plan for discharge tomorrow   LOS: 1 day   Logan Bores 04/13/2018, 10:11 AM

## 2018-04-14 MED ORDER — IBUPROFEN 600 MG PO TABS: 600.0000 mg | ORAL_TABLET | Freq: Four times a day (QID) | ORAL | 1 refills | Status: DC | PRN

## 2018-04-14 MED ORDER — PRENATAL MULTIVITAMIN CH: 1.0000 | ORAL_TABLET | Freq: Every day | ORAL | 3 refills | Status: DC

## 2018-04-14 NOTE — Progress Notes (Signed)
Post Partum Day 2 Subjective: no complaints, up ad lib, voiding, tolerating PO and nl lochia, pain controlled  Objective: Blood pressure 124/81, pulse 78, temperature 97.8 F (36.6 C), temperature source Oral, resp. rate 15, height 5\' 2"  (1.575 m), weight 91.6 kg (202 lb), last menstrual period 06/29/2017, SpO2 99 %, unknown if currently breastfeeding.  Physical Exam:  General: alert and no distress Lochia: appropriate Uterine Fundus: firm  Recent Labs    04/12/18 0757 04/13/18 0628  HGB 12.8 11.2*  HCT 36.2 32.1*    Assessment/Plan: Discharge home, Breastfeeding and Lactation consult.  Routine PP care.  D/c with Motrin and PNV.  F/u 6 wks.     LOS: 2 days   Lindsay Parsons 04/14/2018, 7:51 AM

## 2018-04-14 NOTE — Discharge Summary (Signed)
OB Discharge Summary     Patient Name: Lindsay Parsons DOB: Nov 09, 1986 MRN: [id number]  Date of admission: 04/12/2018 Delivering MD: Carlynn Purl [facility]   Date of discharge: 04/14/2018  Admitting diagnosis: indcution Intrauterine pregnancy: [redacted]w[redacted]d     Secondary diagnosis:  Active Problems:   Postpartum care following vaginal delivery   Term pregnancy   Liveborn infant by vaginal delivery  Additional problems: N/A     Discharge diagnosis: Term Pregnancy Delivered                                                                                                Post partum procedures:N/A  Augmentation: AROM  Complications: None  Hospital course:  Induction of Labor With Vaginal Delivery   32 y.o. yo G2P2002 at [redacted]w[redacted]d was admitted to the hospital 04/12/2018 for induction of labor.  Indication for induction: Favorable cervix at term.  Patient had an uncomplicated labor course as follows: Membrane Rupture Time/Date: 1:15 PM ,04/12/2018   Intrapartum Procedures: Episiotomy: None [1]                                         Lacerations:  None [1]  Patient had delivery of a Viable infant.  Information for the patient's newborn:  Oralee, [place] [[id number]]  Delivery Method: Vaginal, Spontaneous(Filed from Delivery Summary)   04/12/2018  Details of delivery can be found in separate delivery note.  Patient had a routine postpartum course. Patient is discharged home 04/14/18.  Physical exam  Vitals:   04/13/18 0524 04/13/18 0810 04/13/18 1752 04/14/18 0630  BP: 111/68 121/74 123/73 124/81  Pulse: 80 81 78 78  Resp: 18 18 18 15   Temp: 98.2 F (36.8 C) 98 F (36.7 C) 97.6 F (36.4 C) 97.8 F (36.6 C)  TempSrc:  Oral Oral Oral  SpO2: 99%   99%  Weight:      Height:       General: alert and no distress Lochia: appropriate Uterine Fundus: firm  Labs: Lab Results  Component Value Date   WBC 11.7 (H) 04/13/2018   HGB 11.2 (L) 04/13/2018   HCT 32.1 (L) 04/13/2018   MCV 91.7  04/13/2018   PLT 180 04/13/2018   CMP Latest Ref Rng & Units 01/28/2018  Glucose 65 - 99 mg/dL 94  BUN 6 - 20 mg/dL 14  Creatinine 0.44 - 1.00 mg/dL 1.07(H)  Sodium 135 - 145 mmol/L 136  Potassium 3.5 - 5.1 mmol/L 3.8  Chloride 101 - 111 mmol/L 108  CO2 22 - 32 mmol/L 18(L)  Calcium 8.9 - 10.3 mg/dL 8.1(L)  Total Protein 6.5 - 8.1 g/dL -  Total Bilirubin 0.3 - 1.2 mg/dL -  Alkaline Phos 38 - 126 U/L -  AST 15 - 41 U/L -  ALT 14 - 54 U/L -    Discharge instruction: per After Visit Summary and "Baby and Me Booklet".  After visit meds:  Allergies as of 04/14/2018      Reactions   Cephalosporins Hives  Medication List    STOP taking these medications   ondansetron 8 MG disintegrating tablet Commonly known as:  ZOFRAN-ODT   oxyCODONE 5 MG immediate release tablet Commonly known as:  Oxy IR/ROXICODONE   tamsulosin 0.4 MG Caps capsule Commonly known as:  FLOMAX     TAKE these medications   acetaminophen 325 MG tablet Commonly known as:  TYLENOL Take 2 tablets (650 mg total) by mouth every 6 (six) hours as needed for mild pain (or Fever >/= 101).   ibuprofen 600 MG tablet Commonly known as:  ADVIL,MOTRIN Take 1 tablet (600 mg total) by mouth every 6 (six) hours as needed.   prenatal multivitamin Tabs tablet Take 1 tablet by mouth at bedtime.       Diet: routine diet  Activity: Advance as tolerated. Pelvic rest for 6 weeks.   Outpatient follow up:6 weeks Follow up Appt: Future Appointments  Date Time Provider [place]  04/18/2018 10:15 AM OPIC-US OPIC-US [facility]  04/26/2018  8:45 AM OPIC-MR OPIC-MMRI OPIC-Outpati  05/05/2018  1:15 PM Stoioff, Ronda Fairly, MD BUA-BUA None   Follow up Visit:No follow-ups on file.  Postpartum contraception: Undecided  Newborn Data: Live born female  Birth Weight: 9 lb 4.2 oz (4200 g) APGAR: 4, 8  Newborn Delivery   Time head delivered:  04/12/2018 19:13:00 Birth date/time:  04/12/2018 19:15:00 Delivery type:   Vaginal, Spontaneous     Baby Feeding: Bottle Disposition:home with mother   04/14/2018 Janyth Contes, MD

## 2018-04-16 ENCOUNTER — Inpatient Hospital Stay (HOSPITAL_COMMUNITY): Admit: 2018-04-16 | Payer: Self-pay

## 2018-04-18 ENCOUNTER — Ambulatory Visit
Admission: RE | Admit: 2018-04-18 | Discharge: 2018-04-18 | Disposition: A | Discharge status: Home / Self Care | Payer: No Typology Code available for payment source | Source: Ambulatory Visit | Attending: Urology | Admitting: Urology

## 2018-04-18 DIAGNOSIS — N200 Calculus of kidney: Secondary | ICD-10-CM | POA: Insufficient documentation

## 2018-04-26 ENCOUNTER — Ambulatory Visit
Admission: RE | Admit: 2018-04-26 | Discharge: 2018-04-26 | Disposition: A | Discharge status: Home / Self Care | Payer: No Typology Code available for payment source | Source: Ambulatory Visit | Attending: Family Medicine | Admitting: Family Medicine

## 2018-04-26 DIAGNOSIS — R160 Hepatomegaly, not elsewhere classified: Secondary | ICD-10-CM | POA: Insufficient documentation

## 2018-04-26 MED ORDER — GADOBENATE DIMEGLUMINE 529 MG/ML IV SOLN
19.0000 mL | Freq: Once | INTRAVENOUS | Status: AC | PRN
  Administered 2018-04-26: 19 mL via INTRAVENOUS

## 2018-05-05 ENCOUNTER — Encounter: Payer: Self-pay | Admitting: Urology

## 2018-05-05 ENCOUNTER — Ambulatory Visit
Admission: RE | Admit: 2018-05-05 | Discharge: 2018-05-05 | Disposition: A | Discharge status: Home / Self Care | Payer: No Typology Code available for payment source | Source: Ambulatory Visit | Attending: Urology | Admitting: Urology

## 2018-05-05 ENCOUNTER — Ambulatory Visit (INDEPENDENT_AMBULATORY_CARE_PROVIDER_SITE_OTHER): Payer: No Typology Code available for payment source | Admitting: Urology

## 2018-05-05 VITALS — BP 126/85 | HR 81 | Ht 62.0 in | Wt 182.0 lb

## 2018-05-05 DIAGNOSIS — M5090 Cervical disc disorder, unspecified, unspecified cervical region: Secondary | ICD-10-CM | POA: Insufficient documentation

## 2018-05-05 DIAGNOSIS — N200 Calculus of kidney: Secondary | ICD-10-CM

## 2018-05-05 DIAGNOSIS — I471 Supraventricular tachycardia: Secondary | ICD-10-CM | POA: Insufficient documentation

## 2018-05-05 DIAGNOSIS — M542 Cervicalgia: Secondary | ICD-10-CM | POA: Insufficient documentation

## 2018-05-05 NOTE — Progress Notes (Signed)
05/05/2018 1:59 PM   Lindsay Parsons 04/11/1986 [id number]  Referring provider: No referring provider defined for this encounter.  Chief Complaint  Patient presents with  . Nephrolithiasis    HPI: 32 year old female presents for follow-up of nephrolithiasis. She was previously seen when pregnant and was admitted with severe left flank pain.  Renal ultrasound showed a probable 8 mm left lower pole calculus with mild left hydronephrosis.  Her pain had resolved on follow-up and it was recommended a renal ultrasound and KUB be performed after delivery.  She delivered on 04/12/2018.  A renal ultrasound performed on 04/18/2018 showed mild prominence of the left collecting system.  A definite stone was not seen.  A left ureteral jet was visualized.  KUB today was reviewed and there is an 8 mm calcification overlying the midportion of the left renal outline.  She is asymptomatic denies flank, abdominal or pelvic pain.  PMH: Past Medical History:  Diagnosis Date  . Bulging of cervical intervertebral disc   . Kidney stones   . SVT (supraventricular tachycardia) [facility])     Surgical History: Past Surgical History:  Procedure Laterality Date  . FOOT SURGERY  2007   cyst removed from left foot  . TONSILLECTOMY  1989    Home Medications:  Allergies as of 05/05/2018      Reactions   Cephalosporins Hives      Medication List        Accurate as of 05/05/18  1:59 PM. Always use your most recent med list.          ibuprofen 600 MG tablet Commonly known as:  ADVIL,MOTRIN Take 1 tablet (600 mg total) by mouth every 6 (six) hours as needed.   lidocaine 2 % jelly Commonly known as:  XYLOCAINE lidocaine 2 % mucosal jelly   norgestimate-ethinyl estradiol 0.25-35 MG-MCG tablet Commonly known as:  ORTHO-CYCLEN,SPRINTEC,PREVIFEM norgestimate 0.25 mg-ethinyl estradiol 35 mcg tablet  TAKE 1 TABLET BY MOUTH EVERY DAY AS DIRECTED SKIP PLACEBO PILLS       Allergies:  Allergies    Allergen Reactions  . Cephalosporins Hives    Family History: Family History  Problem Relation Age of Onset  . Cancer Father   . Hyperlipidemia Father   . Cancer Maternal Grandmother   . Cancer Maternal Grandfather   . Cancer Paternal Grandfather   . Hypertension Paternal Grandfather     Social History:  reports that she has never smoked. She has never used smokeless tobacco. She reports that she does not drink alcohol or use drugs.  ROS: UROLOGY Frequent Urination?: No Hard to postpone urination?: No Burning/pain with urination?: No Get up at night to urinate?: No Leakage of urine?: No Urine stream starts and stops?: No Trouble starting stream?: No Do you have to strain to urinate?: No Blood in urine?: No Urinary tract infection?: No Sexually transmitted disease?: No Injury to kidneys or bladder?: No Painful intercourse?: No Weak stream?: No Currently pregnant?: No Vaginal bleeding?: No Last menstrual period?: Postpartum   Gastrointestinal Nausea?: No Vomiting?: No Indigestion/heartburn?: No Diarrhea?: No Constipation?: No  Constitutional Fever: No Night sweats?: No Weight loss?: No Fatigue?: No  Skin Skin rash/lesions?: No Itching?: No  Eyes Blurred vision?: No Double vision?: No  Ears/Nose/Throat Sore throat?: No Sinus problems?: No  Hematologic/Lymphatic Swollen glands?: No Easy bruising?: No  Cardiovascular Leg swelling?: No Chest pain?: No  Respiratory Cough?: No Shortness of breath?: No  Endocrine Excessive thirst?: No  Musculoskeletal Back pain?: No Joint pain?: No  Neurological Headaches?: Yes Dizziness?: No  Psychologic Depression?: No Anxiety?: No  Physical Exam: BP 126/85 (BP Location: Left Arm, Patient Position: Sitting, Cuff Size: Large)   Pulse 81   Ht 5\' 2"  (1.575 m)   Wt 182 lb (82.6 kg)   BMI 33.29 kg/m   Constitutional:  Alert and oriented, No acute distress.    Pertinent Imaging: Images were  personally reviewed Results for orders placed during the hospital encounter of 05/05/18  Abdomen 1 view (KUB)   Narrative CLINICAL DATA:  Nephrolithiasis.  EXAM: ABDOMEN - 1 VIEW  COMPARISON:  None.  FINDINGS: The bowel gas pattern is normal. Left renal calculus is noted. Probable phleboliths seen in the left side of the pelvis.  IMPRESSION: Left nephrolithiasis.  No evidence of bowel obstruction or ileus.   Electronically Signed   By: Marijo Conception, M.D.   On: 05/05/2018 12:30     Results for orders placed during the hospital encounter of 04/18/18  Ultrasound renal complete   Narrative CLINICAL DATA:  Nephrolithiasis.  EXAM: RENAL / URINARY TRACT ULTRASOUND COMPLETE  COMPARISON:  01/27/2018  FINDINGS: Right Kidney:  Length: 13.0 cm. Echogenicity within normal limits. No mass or hydronephrosis visualized.  Left Kidney:  Length: 12.2 cm. Echogenicity within normal limits. Interval improvement with very minimal residual hydronephrosis. No focal mass or stones.  Bladder:  Appears normal for degree of bladder distention. Left ureteral jets visualized. Prevoid volume 99 mL and postvoid volume is 0.  4.5 x 4.7 x 5.6 cm isoechoic mass over the right lobe of the liver without significant change.  IMPRESSION: Normal size kidneys with interval improvement left hydronephrosis with minimal residual prominence of the left intrarenal collecting system.  Stable 5.6 cm isoechoic right lower mass. Consider hemangioma protocol CT versus MRI for further evaluation on an elective basis.   Electronically Signed   By: Marin Olp M.D.   On: 04/18/2018 13:11     Assessment & Plan:   31 year old female with an 8 mm nonobstructing left midpole calculus.  Management options were discussed including surveillance, ureteroscopic removal and shockwave lithotripsy.  She has initially elected surveillance and will schedule a follow-up with KUB in 3 months.  She will call back  earlier for development of renal colic or if she decides to have the stone treated.  Return in about 3 months (around 08/05/2018) for KUB.  Abbie Sons, [place] [address], [place], [place] [address] [phone]

## 2018-05-06 LAB — URINALYSIS, COMPLETE
BILIRUBIN UA: NEGATIVE
Glucose, UA: NEGATIVE
KETONES UA: NEGATIVE
LEUKOCYTES UA: NEGATIVE
NITRITE UA: NEGATIVE
Protein, UA: NEGATIVE
Specific Gravity, UA: 1.02 (ref 1.005–1.030)
Urobilinogen, Ur: 0.2 mg/dL (ref 0.2–1.0)
pH, UA: 5 (ref 5.0–7.5)

## 2018-05-06 LAB — MICROSCOPIC EXAMINATION

## 2018-05-19 ENCOUNTER — Encounter: Payer: Self-pay | Admitting: Gastroenterology

## 2018-05-19 ENCOUNTER — Telehealth: Payer: Self-pay | Admitting: Gastroenterology

## 2018-05-19 ENCOUNTER — Encounter

## 2018-05-19 ENCOUNTER — Ambulatory Visit: Payer: No Typology Code available for payment source | Admitting: Gastroenterology

## 2018-05-19 VITALS — BP 148/94 | HR 64 | Ht 62.0 in | Wt 179.0 lb

## 2018-05-19 DIAGNOSIS — K769 Liver disease, unspecified: Secondary | ICD-10-CM

## 2018-05-19 NOTE — Progress Notes (Addendum)
Gastroenterology Consultation  Referring Provider:     Maryland Pink, MD Primary Care Physician:  Maryland Pink, MD Primary Gastroenterologist:  Dr. Allen Norris     Reason for Consultation:     Liver lesion        HPI:   Lindsay Parsons is a 32 y.o. y/o female referred for consultation & management of Liver lesion by Dr. [place] Pink, MD.  This patient had an ultrasound that showed a liver lesion.  The lesion was a 5 cm right liver lobe mass.  The patient then had an MRI that was suggestive of this lesion being a focal nodular hyperplasia versus a hepatic adenoma.  The recommendation was by radiology to repeat the MRI in 6 months off of any hormonal therapy such as birth control. The patient gave birth on the earlier part of June. The patient is also being seen by urology for kidney stones. The ultrasound that found the liver lesion was done due to the patient's visit to the ER for kidney stones whereupon the liver lesion was found.  The patient was under the impression that the lesion was definitely hepatic adenoma although the MRI does not distinguish it between a adenoma versus focal nodular hyperplasia.  The patient has come off her birth control which she is very concerned about doing.  She denies any abdominal pain nausea vomiting fevers chills or jaundice.  The patient's alpha-fetoprotein was negative.  Past Medical History:  Diagnosis Date  . Bulging of cervical intervertebral disc   . Kidney stones   . SVT (supraventricular tachycardia) (HCC)     Past Surgical History:  Procedure Laterality Date  . FOOT SURGERY  2007   cyst removed from left foot  . TONSILLECTOMY  1989    Prior to Admission medications   Medication Sig Start Date End Date Taking? Authorizing Provider  ibuprofen (ADVIL,MOTRIN) 600 MG tablet Take 1 tablet (600 mg total) by mouth every 6 (six) hours as needed. 04/14/18   Bovard-Stuckert, Jeral Fruit, MD  lidocaine (XYLOCAINE) 2 % jelly lidocaine 2 % mucosal jelly  04/18/18   [provider]  norgestimate-ethinyl estradiol (ORTHO-CYCLEN,SPRINTEC,PREVIFEM) 0.25-35 MG-MCG tablet norgestimate 0.25 mg-ethinyl estradiol 35 mcg tablet  TAKE 1 TABLET BY MOUTH EVERY DAY AS DIRECTED SKIP PLACEBO PILLS    [provider]    Family History  Problem Relation Age of Onset  . Cancer Father   . Hyperlipidemia Father   . Cancer Maternal Grandmother   . Cancer Maternal Grandfather   . Cancer Paternal Grandfather   . Hypertension Paternal Grandfather      Social History   Tobacco Use  . Smoking status: Never Smoker  . Smokeless tobacco: Never Used  Substance Use Topics  . Alcohol use: No  . Drug use: No    Allergies as of 05/19/2018 - Review Complete 05/05/2018  Allergen Reaction Noted  . Cephalosporins Hives 02/15/2016    Review of Systems:    All systems reviewed and negative except where noted in HPI.   Physical Exam:  There were no vitals taken for this visit. No LMP recorded. General:   Alert,  Well-developed, well-nourished, pleasant and cooperative in NAD Head:  Normocephalic and atraumatic. Eyes:  Sclera clear, no icterus.   Conjunctiva pink. Ears:  Normal auditory acuity. Nose:  No deformity, discharge, or lesions. Rectal:  Deferred.  Msk:  Symmetrical without gross deformities.  Good, equal movement & strength bilaterally. Pulses:  Normal pulses noted. Extremities:  No clubbing or edema.  No cyanosis. Neurologic:  Alert and oriented x3;  grossly normal neurologically. Skin:  Intact without significant lesions or rashes.  No jaundice. Psych:  Alert and cooperative. Normal mood and affect.  Imaging Studies: Abdomen 1 View (kub)  Result Date: 05/05/2018 CLINICAL DATA:  Nephrolithiasis. EXAM: ABDOMEN - 1 VIEW COMPARISON:  None. FINDINGS: The bowel gas pattern is normal. Left renal calculus is noted. Probable phleboliths seen in the left side of the pelvis. IMPRESSION: Left nephrolithiasis.  No evidence of bowel  obstruction or ileus. Electronically Signed   By: Marijo Conception, M.D.   On: 05/05/2018 12:30   Lindsay Parsons Contrast  Result Date: 04/26/2018 CLINICAL DATA:  Hepatic mass seen on recent ultrasound. EXAM: MRI ABDOMEN WITHOUT AND WITH CONTRAST TECHNIQUE: Multiplanar multisequence Lindsay imaging of the abdomen was performed both before and after the administration of intravenous contrast. CONTRAST:  69mL MULTIHANCE GADOBENATE DIMEGLUMINE 529 MG/ML IV SOLN COMPARISON:  Ultrasound on 04/18/2018 FINDINGS: Lower chest: No acute findings. Hepatobiliary: A mass is seen in segment 7 of the right lobe which measures 5.0 x 4.8 cm. This shows homogeneous arterial phase hyperenhancement, absence of contrast washout, and mild to moderate T2 hyperintensity. This mass shows no evidence of restricted diffusion. Possible central scar on arterial phase. This most likely represents benign focal nodular hyperplasia or hepatic adenoma. No other liver masses are identified. Gallbladder is unremarkable. No evidence of biliary ductal dilatation. Pancreas:  No mass or inflammatory changes. Spleen:  Within normal limits in size and appearance. Adrenals/Urinary Tract: No masses identified. No evidence of hydronephrosis. Stomach/Bowel: Visualized portion unremarkable. Vascular/Lymphatic: No pathologically enlarged lymph nodes identified. No abdominal aortic aneurysm. Other:  None. Musculoskeletal:  No suspicious bone lesions identified. 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 Recommend correlation with tumor markers, and consider follow-up by abdomen MRI without and with Eovist contrast in 6 months (after cessation of oral contraceptives if patient is on them). Electronically Signed   By: Earle Gell M.D.   On: 04/26/2018 10:09    Assessment and Plan:   Lindsay Parsons is a 32 y.o. y/o female who comes in with a incidental finding of a lesion on the liver that an MRI  showed to be in the right lobe of the liver and measured 5 cm x 4.8 cm.  The differential diagnosis as put forth by the MRI was focal nodular hyperplasia versus a hepatic adenoma.  Due to the patient's young age and uncertainty about the nature of the lesion I have recommended the patient to be seen at a tertiary care center where a multidisciplinary approach can be taken with I have pathologist and radiologist who specializes in hepatic lesions with the addition of a liver surgeon.  The patient has agreed to being seen at [facility].  We do not have a dedicated hepatologist.  The patient has been explained the plan and agrees with it.  The [facility] system does not have a transplant hepatologist or transplant surgeon who would be needed to remove this lesion if found to be an adenoma.  The [facility] system also does not have a dedicated hepatologists who has experienced dealing with these complex issues. Therefore the patient will need to be sent to a tertiary care center.  Lucilla Lame, MD. Marval Regal    Note: This dictation was prepared with Dragon dictation along with smaller phrase technology. Any transcriptional errors that result from this process are unintentional.

## 2018-05-19 NOTE — Telephone Encounter (Signed)
PT IS CALLING TO SPEAK WITH [facility] PT JUST LEFT APPOINTMENT AND HAS A FEW QUESTIONS ABOUT [facility] REFERRAL

## 2018-05-23 NOTE — Telephone Encounter (Signed)
Discussed referral to [facility] with pt.

## 2018-05-24 ENCOUNTER — Other Ambulatory Visit: Payer: Self-pay

## 2018-05-25 ENCOUNTER — Telehealth: Payer: Self-pay | Admitting: Gastroenterology

## 2018-05-25 ENCOUNTER — Other Ambulatory Visit: Payer: Self-pay

## 2018-05-25 DIAGNOSIS — K769 Liver disease, unspecified: Secondary | ICD-10-CM

## 2018-05-25 NOTE — Telephone Encounter (Addendum)
Spoke with pt regarding referral to [facility]. Pt's insurance has denied referral to [facility] as they feel [facility] can provide her care. They have recommended pt to be seen by an Oncologist within the [facility]. Due to the uncertainty about the nature of the lesion Dr. Allen Norris feels it would be the best interest of the patient to be seen at a tertiary care center like [facility], where multidisciplinary approach can be taken.   Urgent referral has been made to Oncology. Pt aware.

## 2018-05-25 NOTE — Telephone Encounter (Signed)
Patient Lindsay Parsons that she would like for you to call her back. No reason given.

## 2018-06-06 ENCOUNTER — Inpatient Hospital Stay
Payer: No Typology Code available for payment source | Attending: Hematology and Oncology | Admitting: Hematology and Oncology

## 2018-06-06 ENCOUNTER — Encounter: Payer: Self-pay | Admitting: Hematology and Oncology

## 2018-06-06 DIAGNOSIS — K769 Liver disease, unspecified: Secondary | ICD-10-CM | POA: Diagnosis present

## 2018-06-06 NOTE — Progress Notes (Signed)
Patient here today as new evaluation regarding liver lesion, right lobe.  Referred by Dr. Allen Norris.

## 2018-06-06 NOTE — Progress Notes (Signed)
[facility] day:  06/06/2018  Chief Complaint: Lindsay Parsons is a 32 y.o. female with a right liver lesion who is referred in consultation by Dr. Lucilla Lame for assessment and management.  HPI:  The patient was admitted to [facility] from 01/27/2018 - 01/29/2018 at [redacted] weeks gestation with severe left flank pain. She had microscopic hematuria. Renal ultrasound on 01/27/2018 revealed mild left hydronephrosis. There was a probable 8 mm stone in the lower pole of the left kidney.  There was a 5.6 cm hypoechoic solid-appearing mass in the posterior right hepatic lobe.  MRI was recommended.  She had an uneventful delivery of a baby boy Lindsay Parsons) on 04/12/2018.  Renal ultrasound on 04/18/2018 revealed normal size kidneys with interval improvement left hydronephrosis with minimal residual prominence of the left intrarenal collecting system.  There was a stable 5.6 cm isoechoic right lobe of liver mass.  Liver MRI on 04/26/2018 revealed a 5 cm right hepatic lobe mass, suspicious for focal nodular hyperplasia or hepatic adenoma.  Recommend correlation with tumor markers, and consider follow-up by abdomen MRI without and with Eovist contrast in 6 months.  AFP at [facility] was 7.2 (0-8.3) on 04/29/2018.  She was seen by Dr. Allen Parsons on 05/19/2018.   She was noted to have come off her birth control.  Because of the patient's young age and uncertainty about the nature of the lesion, recommendation was for the patient to be seen at a tertiary care hospital [facility]) for a multi-disciplinary approach with a dedicated liver surgeon and hepatologist.  Patient has been on BCPs since she was in 9th grade.  She was off BCP while pregnant and 1 1/2 weeks after delivery.   Symptomatically, patient notes pain in her RIGHT upper quadrant only with running. She denies fevers, sweats, and weight loss. Patient has intermittent non-specific headaches. Patient denies bleeding; no hematochezia,  melena, or gross hematuria. No post partum bleeding. Patient is markedly fatigued. She has documented B12 deficiency and is on monthly injections. Patient attributes fatigue to work schedule. 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   Patient advises that she maintains an adequate appetite. She is eating well. Weight today is 170 lb 3 oz (77.2 kg). Patient maintains a diet rich in iron. She indicates that she eats meat and green leafy vegetables on a consistent basis.  She is on B12 injections monthly.  Patient denies pain in the clinic today.   Grandmother has a history of lung cancer and leukemia.  Both maternal and paternal grandparents have basal cell skin cancer.    Past Medical History:  Diagnosis Date  . Bulging of cervical intervertebral disc   . Kidney stones   . Renal calculi   . SVT (supraventricular tachycardia) (HCC)     Past Surgical History:  Procedure Laterality Date  . FOOT SURGERY  2007   cyst removed from left foot  . TONSILLECTOMY  1989    Family History  Problem Relation Age of Onset  . Cancer Father   . Hyperlipidemia Father   . Cancer Maternal Grandmother   . Cancer Maternal Grandfather   . Cancer Paternal Grandfather   . Hypertension Paternal Grandfather     Social History:  reports that she has never smoked. She has never used smokeless tobacco. She reports that she drinks alcohol. She reports that she does not use drugs.  She  has 2 children son and daughter).  The patient is employed FT as a Health visitor here at [employer]. Patient denies known exposures to radiation on toxins. The patient is alone today.  Allergies:  Allergies  Allergen Reactions  . Cephalosporins Hives    Current Medications: Current Outpatient Medications  Medication Sig Dispense Refill  . Cyanocobalamin (B-12) 1000 MCG/ML KIT Inject as directed every 30 (thirty) days.    Marland Kitchen glycopyrrolate (ROBINUL) 1 MG  tablet Take 1 mg by mouth daily.     No current facility-administered medications for this visit.     Review of Systems:  GENERAL:  Fatigue.  Active; runner.  No fevers, sweats or weight loss. PERFORMANCE STATUS (ECOG):  1 HEENT:  No visual changes, runny nose, sore throat, mouth sores or tenderness. Lungs: No shortness of breath or cough.  No hemoptysis. Cardiac:  No chest pain, palpitations, orthopnea, or PND. GI:  Right upper abdominal pain when running. No nausea, vomiting, diarrhea, constipation, melena or hematochezia. GU:  No urgency, frequency, dysuria, or hematuria. Musculoskeletal:  No back pain.  No joint pain.  No muscle tenderness. Extremities:  No pain or swelling. Skin:  No rashes or skin changes. Neuro:  No headache, numbness or weakness, balance or coordination issues. Endocrine:  No diabetes, thyroid issues, hot flashes or night sweats. Psych:  No mood changes, depression or anxiety. Pain:  No focal pain. Review of systems:  All other systems reviewed and found to be negative.  Physical Exam: Blood pressure 127/84, pulse 65, temperature (!) 97.3 F (36.3 C), temperature source Tympanic, resp. rate 18, height '5\' 3"'$  (1.6 m), weight 170 lb 3 oz (77.2 kg), not currently breastfeeding. GENERAL:  Well developed, well nourished, woman sitting comfortably in the exam room in no acute distress. MENTAL STATUS:  Alert and oriented to person, place and time. HEAD:  Long brown hair.  Normocephalic, atraumatic, face symmetric, no Cushingoid features. EYES:  Blue eyes.  Pupils equal round and reactive to light and accomodation.  No conjunctivitis or scleral icterus. ENT:  Oropharynx clear without lesion.  Tongue normal. Mucous membranes moist.  RESPIRATORY:  Clear to auscultation without rales, wheezes or rhonchi. CARDIOVASCULAR:  Regular rate and rhythm without murmur, rub or gallop. ABDOMEN:  Abdominal striae.  Soft, non-tender, with active bowel sounds, and no  hepatosplenomegaly.  No masses. SKIN:  No rashes, ulcers or lesions. EXTREMITIES: No edema, no skin discoloration or tenderness.  No palpable cords. LYMPH NODES: No palpable cervical, supraclavicular, axillary or inguinal adenopathy  NEUROLOGICAL: Unremarkable. PSYCH:  Appropriate.   No visits with results within 3 Day(s) from this visit.  Latest known visit with results is:  Office Visit on 05/05/2018  Component Date Value Ref Range Status  . Specific Gravity, UA 05/05/2018 1.020  1.005 - 1.030 Final  . pH, UA 05/05/2018 5.0  5.0 - 7.5 Final  . Color, UA 05/05/2018 Yellow  Yellow Final  . Appearance Ur 05/05/2018 Clear  Clear Final  . Leukocytes, UA 05/05/2018 Negative  Negative Final  . Protein, UA 05/05/2018 Negative  Negative/Trace Final  . Glucose, UA 05/05/2018 Negative  Negative Final  . Ketones, UA 05/05/2018 Negative  Negative Final  . RBC, UA 05/05/2018 Trace  Negative Final  . Bilirubin, UA 05/05/2018 Negative  Negative Final  . Urobilinogen, Ur 05/05/2018 0.2  0.2 - 1.0 mg/dL Final  . Nitrite, UA 05/05/2018 Negative  Negative Final  . Microscopic Examination 05/05/2018 See below:   Final  . WBC, UA 05/05/2018 0-5  0 - 5 /hpf Final  . RBC, UA 05/05/2018 0-2  0 - 2 /hpf Final  . Epithelial Cells (non renal) 05/05/2018 0-10  0 - 10 /hpf Final  . Casts 05/05/2018 Present* None seen /lpf Final  . Cast Type 05/05/2018 Granular casts* N/A Final  . Mucus, UA 05/05/2018 Present* Not Estab. Final  . Bacteria, UA 05/05/2018 Few* None seen/Few Final    Assessment:  Lindsay Parsons is a 32 y.o. female with a right liver lesion noted incidentally during evaluation for severe left flank pain and microscopic hematuria during pregnancy.  She is 8 weeks postpartum.  She has an extensive history of BCP use.  Renal ultrasound on 01/27/2018 revealed mild left hydronephrosis. There was a probable 8 mm stone in the lower pole of the left kidney.  There was a 5.6 cm hypoechoic  solid-appearing mass in the posterior right hepatic lobe.   Renal ultrasound on 04/18/2018 revealed normal size kidneys with interval improvement left hydronephrosis with minimal residual prominence of the left intrarenal collecting system.  There was a stable 5.6 cm isoechoic right lobe of liver mass.  Liver MRI on 04/26/2018 revealed a 5 cm right hepatic lobe mass, suspicious for focal nodular hyperplasia or hepatic adenoma.  AFP at [facility] was 7.2 (0-8.3) on 04/29/2018.  Symptomatically, she notes RUQ pain with running.  Exam is unremarkable.  Plan: 1. Discuss hepatic lesion. 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  Adenomas are associated with oral birth control pills and less commonly with pregnancy.  Patient has been on BCPs for 15-20 years.  Lesions are typically in the right lobe, as is the patient's lesion, and range in size.  Adenomas are at risk for malignant transformation, hemorrhage, and rupture.   Discussed no indication for biopsy secondary to bleeding risk.  Encouraged patient to remain off of BCPs.  Discuss lesions may be considered for surgery if large or symptomatic.  Patient only notes symptoms with running.  Agree with Dr. Dorothey Baseman assessment that patient requires a tertiary care center with a dedicated hepatologist and liver surgeon who will follow the patient and make recommendations regarding future interventions.  [facility] does not have a dedicated hepatology specialist or liver surgeon.    2. Discuss referral to [facility] 3. Follow up PRN.    Honor Loh, NP  06/06/2018, 2:24 PM   I saw and evaluated the patient, participating in the key portions of the service and reviewing pertinent diagnostic studies and records.  I reviewed the nurse practitioner's note and agree with the findings and the plan.  The assessment and plan were discussed with the patient.  Multiple questions were asked by the patient and answered.   Nolon Stalls, MD 06/06/2018,2:24 PM

## 2018-06-07 ENCOUNTER — Other Ambulatory Visit: Payer: Self-pay

## 2018-07-01 ENCOUNTER — Telehealth: Payer: Self-pay

## 2018-07-01 NOTE — Telephone Encounter (Signed)
[facility] regarding 7-30 referral sent. They were unable to locate referral. Referral resent to provided fax [phone]. Oncology Nurse Navigator Documentation  Navigator Location: [facility] (07/01/18 1300)   )Navigator Encounter Type: Telephone (07/01/18 1300) Telephone: Outgoing Call;Patient Update (07/01/18 1300)                                                  Time Spent with Patient: 15 (07/01/18 1300)

## 2018-07-20 ENCOUNTER — Other Ambulatory Visit: Payer: Self-pay | Admitting: Gastroenterology

## 2018-07-20 DIAGNOSIS — R160 Hepatomegaly, not elsewhere classified: Secondary | ICD-10-CM

## 2018-08-10 ENCOUNTER — Ambulatory Visit (INDEPENDENT_AMBULATORY_CARE_PROVIDER_SITE_OTHER): Payer: No Typology Code available for payment source | Admitting: Urology

## 2018-08-10 ENCOUNTER — Other Ambulatory Visit
Admission: AD | Admit: 2018-08-10 | Discharge: 2018-08-10 | Disposition: A | Discharge status: Home / Self Care | Payer: No Typology Code available for payment source | Source: Ambulatory Visit | Attending: Urology | Admitting: Urology

## 2018-08-10 ENCOUNTER — Encounter: Payer: Self-pay | Admitting: Urology

## 2018-08-10 ENCOUNTER — Ambulatory Visit
Admission: RE | Admit: 2018-08-10 | Discharge: 2018-08-10 | Disposition: A | Discharge status: Home / Self Care | Payer: No Typology Code available for payment source | Source: Ambulatory Visit | Attending: Urology | Admitting: Urology

## 2018-08-10 ENCOUNTER — Other Ambulatory Visit: Payer: Self-pay | Admitting: Radiology

## 2018-08-10 VITALS — BP 122/79 | HR 81 | Ht 63.0 in | Wt 181.8 lb

## 2018-08-10 DIAGNOSIS — N200 Calculus of kidney: Secondary | ICD-10-CM | POA: Insufficient documentation

## 2018-08-10 DIAGNOSIS — N201 Calculus of ureter: Secondary | ICD-10-CM | POA: Diagnosis not present

## 2018-08-10 MED ORDER — ONDANSETRON 4 MG PO TBDP: 4.0000 mg | ORAL_TABLET | Freq: Three times a day (TID) | ORAL | 0 refills | Status: DC | PRN

## 2018-08-10 MED ORDER — OXYCODONE-ACETAMINOPHEN 5-325 MG PO TABS: 1.0000 | ORAL_TABLET | Freq: Four times a day (QID) | ORAL | 0 refills | Status: DC | PRN

## 2018-08-10 NOTE — Progress Notes (Signed)
 08/10/2018 11:07 AM   Lindsay Parsons 03/28/1986 [id number]  Referring provider: Hedrick, James, MD [address] [facility] [place], [place] [address]  Chief Complaint  Patient presents with  . Nephrolithiasis    HPI: 32-year-old female presents for follow-up of nephrolithiasis.  She was last seen June 2019 for an 8 mm left midpole renal calculus with and elected surveillance.  She presents for follow-up.  She remains asymptomatic and denies flank, abdominal or pelvic pain.  She has no voiding symptoms and denies gross hematuria.  KUB performed today was reviewed and the stone has migrated from the midpole position to the left proximal ureter.   PMH: Past Medical History:  Diagnosis Date  . Bulging of cervical intervertebral disc   . Kidney stones   . Renal calculi   . SVT (supraventricular tachycardia) (HCC)     Surgical History: Past Surgical History:  Procedure Laterality Date  . FOOT SURGERY  2007   cyst removed from left foot  . TONSILLECTOMY  1989    Home Medications:  Allergies as of 08/10/2018      Reactions   Cephalosporins Hives      Medication List        Accurate as of 08/10/18 11:07 AM. Always use your most recent med list.          B-12 1000 MCG/ML Kit Inject as directed every 30 (thirty) days.   glycopyrrolate 1 MG tablet Commonly known as:  ROBINUL Take 1 mg by mouth daily.   ibuprofen 600 MG tablet Commonly known as:  ADVIL,MOTRIN ibuprofen 600 mg tablet   MIRENA (52 MG) 20 MCG/24HR IUD Generic drug:  levonorgestrel Mirena 20 mcg/24 hours (5 yrs) 52 mg intrauterine device  Take 1 device every day by intrauterine route.       Allergies:  Allergies  Allergen Reactions  . Cephalosporins Hives    Family History: Family History  Problem Relation Age of Onset  . Cancer Father   . Hyperlipidemia Father   . Cancer Maternal Grandmother   . Cancer Maternal Grandfather   . Cancer Paternal Grandfather   .  Hypertension Paternal Grandfather     Social History:  reports that she has never smoked. She has never used smokeless tobacco. She reports that she drinks alcohol. She reports that she does not use drugs.  ROS: UROLOGY Frequent Urination?: No Hard to postpone urination?: No Burning/pain with urination?: No Get up at night to urinate?: No Leakage of urine?: No Urine stream starts and stops?: No Trouble starting stream?: No Do you have to strain to urinate?: No Blood in urine?: No Urinary tract infection?: No Sexually transmitted disease?: No Injury to kidneys or bladder?: No Painful intercourse?: No Weak stream?: No Currently pregnant?: No Vaginal bleeding?: No Last menstrual period?: 07/15/2018  Gastrointestinal Nausea?: No Vomiting?: No Indigestion/heartburn?: No Diarrhea?: No Constipation?: No  Constitutional Fever: No Night sweats?: No Weight loss?: No Fatigue?: No  Skin Skin rash/lesions?: No Itching?: No  Eyes Blurred vision?: No Double vision?: No  Ears/Nose/Throat Sore throat?: No Sinus problems?: No  Hematologic/Lymphatic Swollen glands?: No Easy bruising?: No  Cardiovascular Leg swelling?: No Chest pain?: No  Respiratory Cough?: No Shortness of breath?: No  Endocrine Excessive thirst?: No  Musculoskeletal Back pain?: Yes Joint pain?: No  Neurological Headaches?: No Dizziness?: No  Psychologic Depression?: No Anxiety?: No  Physical Exam: BP 122/79 (BP Location: Left Arm, Patient Position: Sitting, Cuff Size: Normal)   Pulse 81   Ht 5'   3" (1.6 m)   Wt 181 lb 12.8 oz (82.5 kg)   LMP 07/15/2018   BMI 32.20 kg/m   Constitutional:  Alert and oriented, No acute distress. HEENT: [place] AT, moist mucus membranes.  Trachea midline, no masses. Cardiovascular: No clubbing, cyanosis, or edema.  RRR Respiratory: Normal respiratory effort, no increased work of breathing.  Lungs clear GI: Abdomen is soft, nontender, nondistended, no  abdominal masses GU: No CVA tenderness Lymph: No cervical or inguinal lymphadenopathy. Skin: No rashes, bruises or suspicious lesions. Neurologic: Grossly intact, no focal deficits, moving all 4 extremities. Psychiatric: Normal mood and affect.  Pertinent Imaging: Images were personally reviewed  Results for orders placed during the hospital encounter of 08/10/18  Abdomen 1 view (KUB)   Narrative CLINICAL DATA:  Nephrolithiasis.  EXAM: ABDOMEN - 1 VIEW  COMPARISON:  None.  FINDINGS: 5 mm calcification to the left of L3 (patient with 6 non-rib-bearing lumbar vertebra, spinal levels from lower most non-rib-bearing lumbar vertebra), in the region of the proximal ureter. Rounded calcification in the left pelvis is the appearance of phleboliths. IUD in the pelvis. Normal bowel gas pattern. Small to moderate colonic stool burden.  IMPRESSION: 5 mm calcification projects over the region of the left proximal ureter.  Left pelvic calcifications has the appearance of a phlebolith, but may represent a distal ureteric stone.   Electronically Signed   By: Melanie  Sanford M.D.   On: 08/10/2018 03:12     Assessment & Plan:   32-year-old female with migration of a left renal calculus to the left proximal ureter.  She is presently asymptomatic.  Based on stone size it is less likely this will be able to pass spontaneously.  Treatment options were discussed including shockwave lithotripsy and ureteroscopy.  The pros and cons of each treatment were discussed.  She would like to schedule shockwave lithotripsy.  The indications and nature of the planned procedure were discussed as well as the potential benefits and expected outcome.  Alternatives were discussed as described above.  The most common complications and side effects were discussed as outlined in the [facility] consent form.  It was stressed that there is no guarantee that lithotripsy will be successful and she could  require retreatment or alternative treatment.  The rare instance of perirenal bleeding requiring hospitalization, transfusion and rarely surgery were discussed.  The possibility of renal colic from obstructing stone fragments requiring stent placement or ureteroscopy was also discussed.  Prescriptions for oxycodone and Zofran were sent to her pharmacy should she develop renal colic with this ureteral calculus.   Scott C Stoioff, MD  [facility] [address], [address] [place], [place] [address] [phone]  

## 2018-08-10 NOTE — H&P (View-Only) (Signed)
08/10/2018 11:07 AM   Lindsay Parsons Nov 05, 1986 [id number]  Referring provider: Maryland Pink, MD [address] [facility] [place], [place] [address]  Chief Complaint  Patient presents with  . Nephrolithiasis    HPI: 32 year old female presents for follow-up of nephrolithiasis.  She was last seen June 2019 for an 8 mm left midpole renal calculus with and elected surveillance.  She presents for follow-up.  She remains asymptomatic and denies flank, abdominal or pelvic pain.  She has no voiding symptoms and denies gross hematuria.  KUB performed today was reviewed and the stone has migrated from the midpole position to the left proximal ureter.   PMH: Past Medical History:  Diagnosis Date  . Bulging of cervical intervertebral disc   . Kidney stones   . Renal calculi   . SVT (supraventricular tachycardia) [facility])     Surgical History: Past Surgical History:  Procedure Laterality Date  . FOOT SURGERY  2007   cyst removed from left foot  . TONSILLECTOMY  1989    Home Medications:  Allergies as of 08/10/2018      Reactions   Cephalosporins Hives      Medication List        Accurate as of 08/10/18 11:07 AM. Always use your most recent med list.          B-12 1000 MCG/ML Kit Inject as directed every 30 (thirty) days.   glycopyrrolate 1 MG tablet Commonly known as:  ROBINUL Take 1 mg by mouth daily.   ibuprofen 600 MG tablet Commonly known as:  ADVIL,MOTRIN ibuprofen 600 mg tablet   MIRENA (52 MG) 20 MCG/24HR IUD Generic drug:  levonorgestrel Mirena 20 mcg/24 hours (5 yrs) 52 mg intrauterine device  Take 1 device every day by intrauterine route.       Allergies:  Allergies  Allergen Reactions  . Cephalosporins Hives    Family History: Family History  Problem Relation Age of Onset  . Cancer Father   . Hyperlipidemia Father   . Cancer Maternal Grandmother   . Cancer Maternal Grandfather   . Cancer Paternal Grandfather   .  Hypertension Paternal Grandfather     Social History:  reports that she has never smoked. She has never used smokeless tobacco. She reports that she drinks alcohol. She reports that she does not use drugs.  ROS: UROLOGY Frequent Urination?: No Hard to postpone urination?: No Burning/pain with urination?: No Get up at night to urinate?: No Leakage of urine?: No Urine stream starts and stops?: No Trouble starting stream?: No Do you have to strain to urinate?: No Blood in urine?: No Urinary tract infection?: No Sexually transmitted disease?: No Injury to kidneys or bladder?: No Painful intercourse?: No Weak stream?: No Currently pregnant?: No Vaginal bleeding?: No Last menstrual period?: 07/15/2018  Gastrointestinal Nausea?: No Vomiting?: No Indigestion/heartburn?: No Diarrhea?: No Constipation?: No  Constitutional Fever: No Night sweats?: No Weight loss?: No Fatigue?: No  Skin Skin rash/lesions?: No Itching?: No  Eyes Blurred vision?: No Double vision?: No  Ears/Nose/Throat Sore throat?: No Sinus problems?: No  Hematologic/Lymphatic Swollen glands?: No Easy bruising?: No  Cardiovascular Leg swelling?: No Chest pain?: No  Respiratory Cough?: No Shortness of breath?: No  Endocrine Excessive thirst?: No  Musculoskeletal Back pain?: Yes Joint pain?: No  Neurological Headaches?: No Dizziness?: No  Psychologic Depression?: No Anxiety?: No  Physical Exam: BP 122/79 (BP Location: Left Arm, Patient Position: Sitting, Cuff Size: Normal)   Pulse 81   Ht 5'  3" (1.6 m)   Wt 181 lb 12.8 oz (82.5 kg)   LMP 07/15/2018   BMI 32.20 kg/m   Constitutional:  Alert and oriented, No acute distress. HEENT: [place] AT, moist mucus membranes.  Trachea midline, no masses. Cardiovascular: No clubbing, cyanosis, or edema.  RRR Respiratory: Normal respiratory effort, no increased work of breathing.  Lungs clear GI: Abdomen is soft, nontender, nondistended, no  abdominal masses GU: No CVA tenderness Lymph: No cervical or inguinal lymphadenopathy. Skin: No rashes, bruises or suspicious lesions. Neurologic: Grossly intact, no focal deficits, moving all 4 extremities. Psychiatric: Normal mood and affect.  Pertinent Imaging: Images were personally reviewed  Results for orders placed during the hospital encounter of 08/10/18  Abdomen 1 view (KUB)   Narrative CLINICAL DATA:  Nephrolithiasis.  EXAM: ABDOMEN - 1 VIEW  COMPARISON:  None.  FINDINGS: 5 mm calcification to the left of L3 (patient with 6 non-rib-bearing lumbar vertebra, spinal levels from lower most non-rib-bearing lumbar vertebra), in the region of the proximal ureter. Rounded calcification in the left pelvis is the appearance of phleboliths. IUD in the pelvis. Normal bowel gas pattern. Small to moderate colonic stool burden.  IMPRESSION: 5 mm calcification projects over the region of the left proximal ureter.  Left pelvic calcifications has the appearance of a phlebolith, but may represent a distal ureteric stone.   Electronically Signed   By: Keith Rake M.D.   On: 08/10/2018 03:12     Assessment & Plan:   32 year old female with migration of a left renal calculus to the left proximal ureter.  She is presently asymptomatic.  Based on stone size it is less likely this will be able to pass spontaneously.  Treatment options were discussed including shockwave lithotripsy and ureteroscopy.  The pros and cons of each treatment were discussed.  She would like to schedule shockwave lithotripsy.  The indications and nature of the planned procedure were discussed as well as the potential benefits and expected outcome.  Alternatives were discussed as described above.  The most common complications and side effects were discussed as outlined in the [facility] consent form.  It was stressed that there is no guarantee that lithotripsy will be successful and she could  require retreatment or alternative treatment.  The rare instance of perirenal bleeding requiring hospitalization, transfusion and rarely surgery were discussed.  The possibility of renal colic from obstructing stone fragments requiring stent placement or ureteroscopy was also discussed.  Prescriptions for oxycodone and Zofran were sent to her pharmacy should she develop renal colic with this ureteral calculus.   Abbie Sons, [place] [address] [place], [place] [address] [phone]

## 2018-08-11 ENCOUNTER — Other Ambulatory Visit: Payer: No Typology Code available for payment source

## 2018-08-11 DIAGNOSIS — N200 Calculus of kidney: Secondary | ICD-10-CM

## 2018-08-11 LAB — URINALYSIS, COMPLETE
Bilirubin, UA: NEGATIVE
GLUCOSE, UA: NEGATIVE
KETONES UA: NEGATIVE
LEUKOCYTES UA: NEGATIVE
NITRITE UA: NEGATIVE
PROTEIN UA: NEGATIVE
SPEC GRAV UA: 1.025 (ref 1.005–1.030)
Urobilinogen, Ur: 0.2 mg/dL (ref 0.2–1.0)
pH, UA: 6 (ref 5.0–7.5)

## 2018-08-11 LAB — MICROSCOPIC EXAMINATION

## 2018-08-12 ENCOUNTER — Other Ambulatory Visit: Payer: No Typology Code available for payment source

## 2018-08-14 LAB — CULTURE, URINE COMPREHENSIVE

## 2018-08-18 ENCOUNTER — Telehealth: Payer: Self-pay | Admitting: Radiology

## 2018-08-18 NOTE — Telephone Encounter (Signed)
Patient's mother called regarding FMLA paperwork & asks if it was received.

## 2018-08-19 ENCOUNTER — Encounter: Payer: Self-pay | Admitting: Urology

## 2018-08-21 ENCOUNTER — Other Ambulatory Visit: Payer: Self-pay | Admitting: Urology

## 2018-08-21 MED ORDER — AMOXICILLIN 875 MG PO TABS: 875.0000 mg | ORAL_TABLET | Freq: Two times a day (BID) | ORAL | 0 refills | Status: DC

## 2018-08-22 ENCOUNTER — Encounter: Payer: Self-pay | Admitting: Urology

## 2018-08-22 NOTE — Telephone Encounter (Signed)
Made patient aware of positive urine culture & script sent to pharmacy. Patient reports no pain other than "occasional twinges" for the past week. Also asks why RUS from 01/27/2018 showed an 18mm stone while the KUB on 08/10/2018 showed a 40mm stone. She questions if stone may pass without having ESWL? ESWL is scheduled 08/25/2018. Please advise.

## 2018-08-22 NOTE — Telephone Encounter (Signed)
Notified patient of Dr Dene Gentry note below.  Patient would like to proceed with ESWL without further imaging.  The stone measurement on KUB by me is 7-8 mm and not 5 mm. Based on size it is less likely it will pass. She could try for a brief trial of passage if desired. If she wants to get a KUB today or tomorrow and there has been no progression of the stone from her prior KUB it is much less likely it will pass.  ----- Message -----  From: Ranell Patrick, RN  Sent: 08/22/2018  9:57 AM EDT  To: Abbie Sons, MD

## 2018-08-22 NOTE — Telephone Encounter (Signed)
-----   Message from Abbie Sons, MD sent at 08/21/2018  8:53 PM EDT ----- preop urine cx positive- antibiotic rx was sent to pharmacy

## 2018-08-24 ENCOUNTER — Ambulatory Visit
Admission: RE | Admit: 2018-08-24 | Discharge: 2018-08-24 | Disposition: A | Discharge status: Home / Self Care | Payer: No Typology Code available for payment source | Source: Ambulatory Visit | Attending: Gastroenterology | Admitting: Gastroenterology

## 2018-08-24 DIAGNOSIS — K7689 Other specified diseases of liver: Secondary | ICD-10-CM | POA: Insufficient documentation

## 2018-08-24 DIAGNOSIS — R160 Hepatomegaly, not elsewhere classified: Secondary | ICD-10-CM | POA: Diagnosis not present

## 2018-08-24 MED ORDER — GADOXETATE DISODIUM 0.25 MMOL/ML IV SOLN
10.0000 mL | Freq: Once | INTRAVENOUS | Status: AC | PRN
  Administered 2018-08-24: 8 mL via INTRAVENOUS

## 2018-08-24 MED ORDER — CIPROFLOXACIN HCL 500 MG PO TABS
500.0000 mg | ORAL_TABLET | ORAL | Status: AC
  Administered 2018-08-25: 500 mg via ORAL

## 2018-08-25 ENCOUNTER — Encounter
Admission: RE | Disposition: A | Discharge status: Home / Self Care | Payer: Self-pay | Source: Ambulatory Visit | Attending: Urology

## 2018-08-25 ENCOUNTER — Encounter: Payer: Self-pay | Admitting: *Deleted

## 2018-08-25 ENCOUNTER — Ambulatory Visit: Payer: No Typology Code available for payment source

## 2018-08-25 ENCOUNTER — Ambulatory Visit: Payer: Self-pay

## 2018-08-25 ENCOUNTER — Ambulatory Visit
Admission: RE | Admit: 2018-08-25 | Discharge: 2018-08-25 | Disposition: A | Discharge status: Home / Self Care | Payer: No Typology Code available for payment source | Source: Ambulatory Visit | Attending: Urology | Admitting: Urology

## 2018-08-25 DIAGNOSIS — Z881 Allergy status to other antibiotic agents status: Secondary | ICD-10-CM | POA: Insufficient documentation

## 2018-08-25 DIAGNOSIS — I471 Supraventricular tachycardia: Secondary | ICD-10-CM | POA: Insufficient documentation

## 2018-08-25 DIAGNOSIS — Z79899 Other long term (current) drug therapy: Secondary | ICD-10-CM | POA: Insufficient documentation

## 2018-08-25 DIAGNOSIS — N201 Calculus of ureter: Secondary | ICD-10-CM | POA: Diagnosis present

## 2018-08-25 DIAGNOSIS — N200 Calculus of kidney: Secondary | ICD-10-CM

## 2018-08-25 HISTORY — PX: EXTRACORPOREAL SHOCK WAVE LITHOTRIPSY: SHX1557

## 2018-08-25 LAB — POCT PREGNANCY, URINE: PREG TEST UR: NEGATIVE

## 2018-08-25 SURGERY — LITHOTRIPSY, ESWL
Anesthesia: Moderate Sedation | Laterality: Left

## 2018-08-25 MED ORDER — DIAZEPAM 5 MG PO TABS
10.0000 mg | ORAL_TABLET | ORAL | Status: AC
  Administered 2018-08-25: 10 mg via ORAL

## 2018-08-25 MED ORDER — ONDANSETRON HCL 4 MG/2ML IJ SOLN
INTRAMUSCULAR | Status: AC
  Administered 2018-08-25: 4 mg via INTRAVENOUS
  Filled 2018-08-25: qty 2

## 2018-08-25 MED ORDER — KETOROLAC TROMETHAMINE 30 MG/ML IJ SOLN
INTRAMUSCULAR | Status: AC
  Administered 2018-08-25: 30 mg
  Filled 2018-08-25: qty 1

## 2018-08-25 MED ORDER — ALFUZOSIN HCL ER 10 MG PO TB24: 10.0000 mg | ORAL_TABLET | Freq: Every day | ORAL | 0 refills | Status: AC

## 2018-08-25 MED ORDER — DIAZEPAM 5 MG PO TABS
ORAL_TABLET | ORAL | Status: AC
  Administered 2018-08-25: 10 mg via ORAL
  Filled 2018-08-25: qty 2

## 2018-08-25 MED ORDER — SODIUM CHLORIDE 0.9 % IV SOLN
INTRAVENOUS | Status: DC
  Administered 2018-08-25: 07:00:00 via INTRAVENOUS

## 2018-08-25 MED ORDER — DIPHENHYDRAMINE HCL 25 MG PO CAPS
ORAL_CAPSULE | ORAL | Status: AC
  Administered 2018-08-25: 25 mg via ORAL
  Filled 2018-08-25: qty 1

## 2018-08-25 MED ORDER — DIPHENHYDRAMINE HCL 25 MG PO CAPS
25.0000 mg | ORAL_CAPSULE | ORAL | Status: AC
  Administered 2018-08-25: 25 mg via ORAL

## 2018-08-25 MED ORDER — CIPROFLOXACIN HCL 500 MG PO TABS
ORAL_TABLET | ORAL | Status: AC
  Administered 2018-08-25: 500 mg via ORAL
  Filled 2018-08-25: qty 1

## 2018-08-25 MED ORDER — ONDANSETRON HCL 4 MG/2ML IJ SOLN
4.0000 mg | Freq: Once | INTRAMUSCULAR | Status: AC | PRN
  Administered 2018-08-25 (×2): 4 mg via INTRAVENOUS

## 2018-08-25 NOTE — Discharge Instructions (Signed)

## 2018-08-25 NOTE — Interval H&P Note (Signed)
History and Physical Interval Note:  08/25/2018 7:20 AM  Lindsay Parsons  has presented today for surgery, with the diagnosis of Kidney stone  The various methods of treatment have been discussed with the patient and family. After consideration of risks, benefits and other options for treatment, the patient has consented to  Procedure(s): EXTRACORPOREAL SHOCK WAVE LITHOTRIPSY (ESWL) (Left) as a surgical intervention .  The patient's history has been reviewed, patient examined, no change in status, stable for surgery.  I have reviewed the patient's chart and labs.  Questions were answered to the patient's satisfaction.     [place]

## 2018-08-26 ENCOUNTER — Encounter: Payer: Self-pay | Admitting: Urology

## 2018-08-30 ENCOUNTER — Ambulatory Visit: Payer: Self-pay

## 2018-09-11 ENCOUNTER — Ambulatory Visit
Admission: RE | Admit: 2018-09-11 | Discharge: 2018-09-11 | Disposition: A | Discharge status: Home / Self Care | Payer: No Typology Code available for payment source | Attending: Urology | Admitting: Urology

## 2018-09-11 ENCOUNTER — Ambulatory Visit
Admission: RE | Admit: 2018-09-11 | Discharge: 2018-09-11 | Disposition: A | Discharge status: Home / Self Care | Payer: No Typology Code available for payment source | Source: Ambulatory Visit | Attending: Urology | Admitting: Urology

## 2018-09-11 DIAGNOSIS — N200 Calculus of kidney: Secondary | ICD-10-CM

## 2018-09-11 DIAGNOSIS — N201 Calculus of ureter: Secondary | ICD-10-CM | POA: Insufficient documentation

## 2018-09-12 ENCOUNTER — Other Ambulatory Visit: Payer: Self-pay

## 2018-09-12 ENCOUNTER — Ambulatory Visit (INDEPENDENT_AMBULATORY_CARE_PROVIDER_SITE_OTHER): Payer: No Typology Code available for payment source | Admitting: Urology

## 2018-09-12 ENCOUNTER — Encounter: Payer: Self-pay | Admitting: Urology

## 2018-09-12 VITALS — BP 126/86 | HR 76 | Ht 63.0 in | Wt 184.0 lb

## 2018-09-12 DIAGNOSIS — Z87442 Personal history of urinary calculi: Secondary | ICD-10-CM

## 2018-09-12 NOTE — Progress Notes (Signed)
09/12/2018 2:29 PM   Lindsay Parsons 1986/09/19 [id number]  Referring provider: Maryland Pink, MD [address] [facility] [place], [place] [address]  Chief Complaint  Patient presents with  . Routine Post Op    HPI: 32 year old female presents for postop follow-up.  She is status post shockwave lithotripsy for an 8 mm left proximal ureteral calculus on 08/25/2018.  She passed multiple small fragments within 24 hours of her surgery and had no significant pain.  It is been 2 weeks since she noticed any fragments.  She has no complaints today.  KUB today was reviewed and the previously seen calculus is no longer identified.   PMH: Past Medical History:  Diagnosis Date  . Bulging of cervical intervertebral disc   . Kidney stones   . Renal calculi   . SVT (supraventricular tachycardia) [facility])     Surgical History: Past Surgical History:  Procedure Laterality Date  . EXTRACORPOREAL SHOCK WAVE LITHOTRIPSY Left 08/25/2018   Procedure: EXTRACORPOREAL SHOCK WAVE LITHOTRIPSY (ESWL);  Surgeon: Abbie Sons, MD;  Location: [facility] ORS;  Service: Urology;  Laterality: Left;  . FOOT SURGERY  2007   cyst removed from left foot  . TONSILLECTOMY  1989    Home Medications:  Allergies as of 09/12/2018      Reactions   Cefuroxime Axetil Dermatitis   Cephalosporins Hives   Other Itching   cephsporians      Medication List        Accurate as of 09/12/18  2:29 PM. Always use your most recent med list.          cyanocobalamin 1000 MCG/ML injection Commonly known as:  (VITAMIN B-12)   glycopyrrolate 1 MG tablet Commonly known as:  ROBINUL Take 1 mg by mouth daily.   MIRENA (52 MG) 20 MCG/24HR IUD Generic drug:  levonorgestrel Mirena 20 mcg/24 hours (5 yrs) 52 mg intrauterine device  Take 1 device every day by intrauterine route.       Allergies:  Allergies  Allergen Reactions  . Cefuroxime Axetil Dermatitis  . Cephalosporins Hives  . Other Itching   cephsporians    Family History: Family History  Problem Relation Age of Onset  . Cancer Father   . Hyperlipidemia Father   . Cancer Maternal Grandmother   . Cancer Maternal Grandfather   . Cancer Paternal Grandfather   . Hypertension Paternal Grandfather     Social History:  reports that she has never smoked. She has never used smokeless tobacco. She reports that she drinks alcohol. She reports that she does not use drugs.  ROS: UROLOGY Frequent Urination?: No Hard to postpone urination?: No Burning/pain with urination?: No Get up at night to urinate?: No Leakage of urine?: No Urine stream starts and stops?: No Trouble starting stream?: No Do you have to strain to urinate?: No Blood in urine?: No Urinary tract infection?: No Sexually transmitted disease?: No Injury to kidneys or bladder?: No Painful intercourse?: No Weak stream?: No Currently pregnant?: No Vaginal bleeding?: No Last menstrual period?: 08/14/2018  Gastrointestinal Nausea?: No Vomiting?: No Indigestion/heartburn?: No Diarrhea?: No Constipation?: No  Constitutional Fever: No Night sweats?: No Weight loss?: No Fatigue?: No  Skin Skin rash/lesions?: No Itching?: No  Eyes Blurred vision?: No Double vision?: No  Ears/Nose/Throat Sore throat?: No Sinus problems?: No  Hematologic/Lymphatic Swollen glands?: No Easy bruising?: No  Cardiovascular Leg swelling?: No Chest pain?: No  Respiratory Cough?: No Shortness of breath?: No  Endocrine Excessive thirst?: No  Musculoskeletal  Back pain?: No Joint pain?: No  Neurological Headaches?: No Dizziness?: No  Psychologic Depression?: No Anxiety?: No  Physical Exam: BP 126/86 (BP Location: Left Arm, Patient Position: Sitting, Cuff Size: Large)   Pulse 76   Ht 5\' 3"  (1.6 m)   Wt 184 lb (83.5 kg)   BMI 32.59 kg/m   Constitutional:  Alert and oriented, No acute distress.   Pertinent Imaging: KUB was personally reviewed.   Radiologic interpretation is that the stone is still present however no calculus or fragments are seen on my review. Results for orders placed during the hospital encounter of 09/11/18  Abdomen 1 view (KUB)   Narrative CLINICAL DATA:  Follow-up ureteral stone  EXAM: ABDOMEN - 1 VIEW  COMPARISON:  08/25/2018  FINDINGS: Scattered large and small bowel gas is noted. The previously seen left ureteral stone is again identified adjacent to the L2 transverse process. IUD is noted in place. No other focal abnormality is seen.  IMPRESSION: Stable appearance of left ureteral stone.   Electronically Signed   By: Inez Catalina M.D.   On: 09/11/2018 19:39     Assessment & Plan:   Good results status post shockwave lithotripsy of a left proximal ureteral calculus.  She did not save stone fragments for analysis however based on the KUB appearance it would be a calcium oxalate stone.  Her to previous stones have been related to pregnancy and she does not desire to pursue a metabolic evaluation.  Will obtain a post procedure renal ultrasound in approximately 4-6 weeks.  Follow-up 6 months with a KUB.   Abbie Sons, [place] [address] [place], [place] [address] [phone]

## 2018-09-19 ENCOUNTER — Other Ambulatory Visit: Payer: Self-pay | Admitting: Family Medicine

## 2018-09-19 DIAGNOSIS — R160 Hepatomegaly, not elsewhere classified: Secondary | ICD-10-CM

## 2018-09-23 ENCOUNTER — Telehealth: Payer: No Typology Code available for payment source | Admitting: Family

## 2018-09-23 DIAGNOSIS — J329 Chronic sinusitis, unspecified: Secondary | ICD-10-CM

## 2018-09-23 DIAGNOSIS — B9689 Other specified bacterial agents as the cause of diseases classified elsewhere: Secondary | ICD-10-CM | POA: Diagnosis not present

## 2018-09-23 MED ORDER — AMOXICILLIN-POT CLAVULANATE 875-125 MG PO TABS: 1.0000 | ORAL_TABLET | Freq: Two times a day (BID) | ORAL | 0 refills | Status: AC

## 2018-09-23 NOTE — Progress Notes (Signed)

## 2018-10-10 ENCOUNTER — Ambulatory Visit: Payer: No Typology Code available for payment source | Admitting: Physical Therapy

## 2018-10-12 ENCOUNTER — Encounter: Payer: Self-pay | Admitting: Physical Therapy

## 2018-10-13 ENCOUNTER — Ambulatory Visit
Admission: RE | Admit: 2018-10-13 | Discharge: 2018-10-13 | Disposition: A | Discharge status: Home / Self Care | Payer: No Typology Code available for payment source | Source: Ambulatory Visit | Attending: Urology | Admitting: Urology

## 2018-10-13 DIAGNOSIS — Z87442 Personal history of urinary calculi: Secondary | ICD-10-CM | POA: Diagnosis not present

## 2018-10-14 ENCOUNTER — Telehealth: Payer: Self-pay | Admitting: Family Medicine

## 2018-10-14 NOTE — Telephone Encounter (Signed)
-----   Message from Abbie Sons, MD sent at 10/14/2018  1:09 PM EST ----- Renal ultrasound showed no calculi or hydronephrosis.  Keep May 2020 follow-up

## 2018-10-14 NOTE — Telephone Encounter (Signed)
Patient notified. Via Voicemail.

## 2018-10-25 ENCOUNTER — Encounter: Payer: Self-pay | Admitting: Physical Therapy

## 2019-03-13 ENCOUNTER — Ambulatory Visit: Payer: No Typology Code available for payment source | Admitting: Urology

## 2019-03-14 ENCOUNTER — Other Ambulatory Visit: Payer: Self-pay

## 2019-03-14 ENCOUNTER — Ambulatory Visit
Admission: RE | Admit: 2019-03-14 | Discharge: 2019-03-14 | Disposition: A | Discharge status: Home / Self Care | Payer: No Typology Code available for payment source | Source: Ambulatory Visit | Attending: Urology | Admitting: Urology

## 2019-03-14 DIAGNOSIS — Z87442 Personal history of urinary calculi: Secondary | ICD-10-CM

## 2019-03-15 ENCOUNTER — Telehealth (INDEPENDENT_AMBULATORY_CARE_PROVIDER_SITE_OTHER): Payer: No Typology Code available for payment source | Admitting: Urology

## 2019-03-15 ENCOUNTER — Telehealth: Payer: Self-pay

## 2019-03-15 DIAGNOSIS — Z87442 Personal history of urinary calculi: Secondary | ICD-10-CM

## 2019-03-15 NOTE — Progress Notes (Signed)
Virtual Visit via Video Note  I connected with Lindsay Parsons on 03/15/19 at  2:00 PM EDT by a video enabled telemedicine application and verified that I am speaking with the correct person using two identifiers.  Location: Patient: Home Provider: Home office   I discussed the limitations of evaluation and management by telemedicine and the availability of in person appointments. The patient expressed understanding and agreed to proceed.  History of Present Illness: 33 year old female with a history of recurrent stone disease during pregnancy.  She underwent shockwave lithotripsy of an 8 mm left proximal ureteral calculus on 08/25/2018.  She had no complaints and follow-up imaging was negative.  She has done well the past 6 months and denies flank/abdominal/pelvic pain.  KUB performed on 03/14/2019 was reviewed and no calcifications suspicious for urinary tract stones were identified.   Observations/Objective: Alert, in no acute distress  Assessment and Plan: 33 year old female with history of urinary calculi.  No evidence of recurrent stones.  Follow Up Instructions: 1 year with KUB   I discussed the assessment and treatment plan with the patient. The patient was provided an opportunity to ask questions and all were answered. The patient agreed with the plan and demonstrated an understanding of the instructions.   The patient was advised to call back or seek an in-person evaluation if the symptoms worsen or if the condition fails to improve as anticipated.  I provided 5 minutes of non-face-to-face time during this encounter.   Abbie Sons, MD

## 2019-05-13 ENCOUNTER — Telehealth: Payer: Self-pay

## 2019-05-13 ENCOUNTER — Other Ambulatory Visit: Payer: Self-pay

## 2019-05-13 ENCOUNTER — Ambulatory Visit
Admission: EM | Admit: 2019-05-13 | Discharge: 2019-05-13 | Disposition: A | Discharge status: Home / Self Care | Payer: No Typology Code available for payment source | Attending: Family Medicine | Admitting: Family Medicine

## 2019-05-13 DIAGNOSIS — J029 Acute pharyngitis, unspecified: Secondary | ICD-10-CM

## 2019-05-13 DIAGNOSIS — Z7189 Other specified counseling: Secondary | ICD-10-CM

## 2019-05-13 LAB — RAPID STREP SCREEN (MED CTR MEBANE ONLY): Streptococcus, Group A Screen (Direct): NEGATIVE

## 2019-05-13 MED ORDER — VALACYCLOVIR HCL 1 G PO TABS: 1000.0000 mg | ORAL_TABLET | Freq: Two times a day (BID) | ORAL | 0 refills | Status: AC

## 2019-05-13 MED ORDER — LIDOCAINE VISCOUS HCL 2 % MT SOLN: 10.0000 mL | Freq: Three times a day (TID) | OROMUCOSAL | 0 refills | Status: DC | PRN

## 2019-05-13 NOTE — Discharge Instructions (Addendum)
Take medication as prescribed. Rest. Drink plenty of fluids. Monitor.  COVID-19 testing.  Refer to [facility] information and follow.  Follow up with your primary care physician this week as needed. Return to Urgent care for new or worsening concerns.

## 2019-05-13 NOTE — Telephone Encounter (Signed)
-----   Message from Marylene Land, NP sent at 05/13/2019  2:58 PM EDT ----- Regarding: covid 19 test

## 2019-05-13 NOTE — ED Provider Notes (Signed)
[facility] ____________________________________________  Time seen: Approximately 2:57 PM  I have reviewed the triage vital signs and the nursing notes.   HISTORY  Chief Complaint Sore Throat   HPI Lindsay Parsons is a 33 y.o. female presenting for evaluation of sore throat that started this past Tuesday while at the beach.  States sore throat is currently mild.  Over-the-counter ibuprofen does help.  States that she can see a ulcer-like sore to the area of the pain.  States sore throat is mostly on the left side where the sore is.  Continues to eat and drink well.  No known direct familial sick contacts.  Has recently been at the beach with her family, but reports same contacts as normal.  Denies accompanying cough, congestion, fevers, chest pain, shortness of breath, rash or other changes.  Denies other aggravating or alleviating factors.  Mary[place] Pink, MD: PCP No LMP recorded. (Menstrual status: IUD).   Past Medical History:  Diagnosis Date  . Bulging of cervical intervertebral disc   . Kidney stones   . Renal calculi   . SVT (supraventricular tachycardia) [facility])     Patient Active Problem List   Diagnosis Date Noted  . Liver lesion, right lobe 06/06/2018  . Cervical disc disease 05/05/2018  . Neck pain 05/05/2018  . SVT (supraventricular tachycardia) ([place]) 05/05/2018  . Term pregnancy 04/12/2018  . Liveborn infant by vaginal delivery 04/12/2018  . Hepatic cyst 01/29/2018  . Kidney stone complicating pregnancy, third trimester 01/27/2018  . Active labor 02/16/2016  . SVD (spontaneous vaginal delivery) 02/16/2016  . Postpartum care following vaginal delivery 02/16/2016  . White coat hypertension 05/10/2012  . Headache 05/10/2012  . Routine health maintenance 03/21/2012  . Contact dermatitis 02/29/2012    Past Surgical History:  Procedure Laterality Date  . EXTRACORPOREAL SHOCK WAVE LITHOTRIPSY Left 08/25/2018   Procedure: EXTRACORPOREAL SHOCK  WAVE LITHOTRIPSY (ESWL);  Surgeon: Abbie Sons, MD;  Location: [facility] ORS;  Service: Urology;  Laterality: Left;  . FOOT SURGERY  2007   cyst removed from left foot  . TONSILLECTOMY  1989     No current facility-administered medications for this encounter.   Current Outpatient Medications:  .  cyanocobalamin (,VITAMIN B-12,) 1000 MCG/ML injection, , Disp: , Rfl: 0 .  levonorgestrel (MIRENA, 52 MG,) 20 MCG/24HR IUD, Mirena 20 mcg/24 hours (5 yrs) 52 mg intrauterine device  Take 1 device every day by intrauterine route., Disp: , Rfl:  .  lidocaine (XYLOCAINE) 2 % solution, Use as directed 10 mLs in the mouth or throat every 8 (eight) hours as needed (sore throat. gargle and spit as needed for sore throat.)., Disp: 100 mL, Rfl: 0 .  valACYclovir (VALTREX) 1000 MG tablet, Take 1 tablet (1,000 mg total) by mouth 2 (two) times daily for 7 days., Disp: 14 tablet, Rfl: 0  Allergies Cefuroxime axetil, Cephalosporins, and Other  Family History  Problem Relation Age of Onset  . Cancer Father   . Hyperlipidemia Father   . Cancer Maternal Grandmother   . Cancer Maternal Grandfather   . Cancer Paternal Grandfather   . Hypertension Paternal Grandfather     Social History Social History   Tobacco Use  . Smoking status: Never Smoker  . Smokeless tobacco: Never Used  Substance Use Topics  . Alcohol use: Yes    Comment: 3 or 4 beers a month  . Drug use: No    Review of Systems Constitutional: No fever ENT: Positive sore throat. Cardiovascular: Denies chest pain.  Respiratory: Denies shortness of breath. Gastrointestinal: No abdominal pain.  No nausea, no vomiting.  No diarrhea.   Skin: Negative for rash.  ____________________________________________   PHYSICAL EXAM:  VITAL SIGNS: ED Triage Vitals  Enc Vitals Group     BP 05/13/19 1427 123/84     Pulse Rate 05/13/19 1427 69     Resp 05/13/19 1427 16     Temp 05/13/19 1427 98.6 F (37 C)     Temp Source 05/13/19 1427 Oral      SpO2 05/13/19 1427 100 %     Weight 05/13/19 1428 174 lb (78.9 kg)     Height 05/13/19 1428 5\' 3"  (1.6 m)     Head Circumference --      Peak Flow --      Pain Score 05/13/19 1427 6     Pain Loc --      Pain Edu? --      Excl. in [place]? --     Constitutional: Alert and oriented. Well appearing and in no acute distress. Eyes: Conjunctivae are normal. Head: Atraumatic. No sinus tenderness to palpation. No swelling. No erythema.  Ears: no erythema, normal TMs bilaterally.   Nose:No nasal congestion  Mouth/Throat: Mucous membranes are moist. Mild pharyngeal erythema.  Tonsillectomy.  Left pharyngeal less than 0.5 cm ulcerative lesion.  No other oral lesions noted. Neck: No stridor.  No cervical spine tenderness to palpation. Hematological/Lymphatic/Immunilogical: No cervical lymphadenopathy. Cardiovascular: Normal rate, regular rhythm. Grossly normal heart sounds.  Good peripheral circulation. Respiratory: Normal respiratory effort.  No retractions. No wheezes, rales or rhonchi. Good air movement.  Musculoskeletal: Ambulatory with steady gait.  Neurologic:  Normal speech and language. No gait instability. Skin:  Skin appears warm, dry and intact. No rash noted. Psychiatric: Mood and affect are normal. Speech and behavior are normal. ___________________________________________   LABS (all labs ordered are listed, but only abnormal results are displayed)  Labs Reviewed  RAPID STREP SCREEN ([facility] ONLY)  CULTURE, GROUP A STREP [facility])   ____________________________________________   PROCEDURES Procedures   INITIAL IMPRESSION / ASSESSMENT AND PLAN / ED COURSE  Pertinent labs & imaging results that were available during my care of the patient were reviewed by me and considered in my medical decision making (see chart for details).  Well-appearing patient.  No acute distress.  Pharyngitis complaints.  Strep negative, will culture.  Suspect viral illness.  Discussed potential  herpetic viral as well as COVID.  Recommend COVID-19 testing.  [place] DHHS information given and instructed to follow.  Will treat empirically with Valtrex and PRN viscous lidocaine.  Continue ibuprofen and supportive care.  Rest and fluids.Discussed indication, risks and benefits of medications with patient.  Discussed follow up with Primary care physician this week as needed. Discussed follow up and return parameters including no resolution or any worsening concerns. Patient verbalized understanding and agreed to plan.   ____________________________________________   FINAL CLINICAL IMPRESSION(S) / ED DIAGNOSES  Final diagnoses:  Acute pharyngitis, unspecified etiology  Advice Given About Covid-19 Virus Infection     ED Discharge Orders         Ordered    valACYclovir (VALTREX) 1000 MG tablet  2 times daily     05/13/19 1500    lidocaine (XYLOCAINE) 2 % solution  Every 8 hours PRN     05/13/19 1500           Note: This dictation was prepared with Dragon dictation along with smaller phrase technology. Any transcriptional errors  that result from this process are unintentional.         Marylene Land, NP 05/13/19 1540

## 2019-05-13 NOTE — Telephone Encounter (Signed)
Called pt to schedule Covid testing. Pt wanting 2 hour quick test. Informed pt that unable to schedule for quick test. Pt stated she will call Occupational Health and will call back if unable to schedule rapid testing.

## 2019-05-13 NOTE — ED Triage Notes (Signed)
Patient complains of sore throat that started on Tuesday and reports that she just got back from the beach.

## 2019-05-16 LAB — CULTURE, GROUP A STREP (THRC)

## 2019-06-01 NOTE — Telephone Encounter (Signed)
error 

## 2019-06-05 ENCOUNTER — Ambulatory Visit
Admission: RE | Admit: 2019-06-05 | Discharge: 2019-06-05 | Disposition: A | Discharge status: Home / Self Care | Payer: No Typology Code available for payment source | Source: Ambulatory Visit | Attending: Family Medicine | Admitting: Family Medicine

## 2019-06-05 ENCOUNTER — Other Ambulatory Visit: Payer: Self-pay

## 2019-06-05 DIAGNOSIS — R160 Hepatomegaly, not elsewhere classified: Secondary | ICD-10-CM | POA: Insufficient documentation

## 2019-06-05 MED ORDER — GADOBUTROL 1 MMOL/ML IV SOLN
7.0000 mL | Freq: Once | INTRAVENOUS | Status: AC | PRN
  Administered 2019-06-05: 7 mL via INTRAVENOUS

## 2019-10-19 ENCOUNTER — Other Ambulatory Visit: Payer: Self-pay | Admitting: Unknown Physician Specialty

## 2019-10-19 DIAGNOSIS — R519 Headache, unspecified: Secondary | ICD-10-CM

## 2019-10-31 ENCOUNTER — Ambulatory Visit: Payer: No Typology Code available for payment source

## 2019-11-02 ENCOUNTER — Ambulatory Visit
Admission: RE | Admit: 2019-11-02 | Discharge: 2019-11-02 | Disposition: A | Discharge status: Home / Self Care | Payer: No Typology Code available for payment source | Source: Ambulatory Visit | Attending: Unknown Physician Specialty | Admitting: Unknown Physician Specialty

## 2019-11-02 ENCOUNTER — Other Ambulatory Visit: Payer: Self-pay

## 2019-11-02 DIAGNOSIS — R519 Headache, unspecified: Secondary | ICD-10-CM | POA: Diagnosis not present

## 2019-11-02 MED ORDER — GADOBUTROL 1 MMOL/ML IV SOLN
8.0000 mL | Freq: Once | INTRAVENOUS | Status: AC | PRN
  Administered 2019-11-02: 8 mL via INTRAVENOUS

## 2019-12-20 ENCOUNTER — Encounter: Payer: Self-pay | Admitting: Skilled Nursing Facility1

## 2019-12-20 ENCOUNTER — Other Ambulatory Visit: Payer: Self-pay

## 2019-12-20 ENCOUNTER — Encounter: Payer: No Typology Code available for payment source | Attending: Family Medicine | Admitting: Skilled Nursing Facility1

## 2019-12-20 DIAGNOSIS — E639 Nutritional deficiency, unspecified: Secondary | ICD-10-CM
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
## 2020-01-30 ENCOUNTER — Ambulatory Visit: Payer: No Typology Code available for payment source | Admitting: Dietician

## 2020-02-09 ENCOUNTER — Other Ambulatory Visit: Payer: Self-pay | Admitting: Family Medicine

## 2020-02-09 DIAGNOSIS — K7689 Other specified diseases of liver: Secondary | ICD-10-CM

## 2020-02-09 DIAGNOSIS — D134 Benign neoplasm of liver: Secondary | ICD-10-CM

## 2020-02-13 ENCOUNTER — Other Ambulatory Visit: Payer: Self-pay

## 2020-02-13 ENCOUNTER — Encounter: Payer: Self-pay | Admitting: Dietician

## 2020-02-13 ENCOUNTER — Encounter: Payer: No Typology Code available for payment source | Attending: Family Medicine | Admitting: Dietician

## 2020-02-13 VITALS — Ht 63.0 in | Wt 177.9 lb

## 2020-02-13 DIAGNOSIS — Z713 Dietary counseling and surveillance: Secondary | ICD-10-CM

## 2020-02-13 NOTE — Patient Instructions (Signed)
   Continue exercising regularly  -Bring a snack to work to help with hunger  -Incorporating more vegetables into dinner meal

## 2020-02-13 NOTE — Progress Notes (Signed)
[place] Employee "self referral" nutrition session: Start time: 0915   End time: 1000  Height: 5'3" Weight: 177.9lbs  Met with employee to discuss her nutritional concerns and diet history.    Progress: Pt starting sleep study later on this month.   Pt reports fatigue and tingling in extremities.   Pt reports that vitamin D was low at last neurology visit, currently taking supplement.   Pt reports also taking Vitamin B12 injections periodically. Pt reports that time is concern with preparing meals for family.   Physical activity:  Exercise five times per week using a 10-minute workout video.  Pt reports using elliptical in the home a couple times a week.     Typical eating pattern: Breakfast: ham and cheese omelet, banana and peanut butter crackers, sometime no time for breakfast, sausage, egg, and english muffin  Snack:  Lunch: leftover dinner from the night before like tacos, pizza; yogurt Snack:  Supper: pizza, tacos, pork chops, steak, hot dogs, greek chicken, stuffed manicotti  Snack: popcorn, rarely have dessert with kids  Beverages: 40-60 during work days, 60+ on days off   Education topics covered during this visit:  General nutrition/ Healthy eating  Exercise  [employer] Concerns   Educational resources provided:  [employer] and/or recipes   Additional Comments:    Plan: -Continue exercising regularly -Bring a snack to work to help with hunger -Incorporating more vegetables into dinner meal

## 2020-03-11 ENCOUNTER — Encounter: Payer: Self-pay | Admitting: Dietician

## 2020-03-11 ENCOUNTER — Encounter: Payer: No Typology Code available for payment source | Attending: Family Medicine | Admitting: Dietician

## 2020-03-11 ENCOUNTER — Other Ambulatory Visit: Payer: Self-pay

## 2020-03-11 VITALS — Ht 63.0 in | Wt 174.2 lb

## 2020-03-11 DIAGNOSIS — Z713 Dietary counseling and surveillance: Secondary | ICD-10-CM

## 2020-03-11 NOTE — Patient Instructions (Signed)
   Resume regular exercise once neck pain has resolved.  Continue with planning balanced meals, great job!

## 2020-03-11 NOTE — Progress Notes (Signed)
[place] Employee "self referral" nutrition session: Start time: 0935   End time: 1000  Height: 5'3" Weight: 174.2lbs  Met with employee to discuss his/her nutritional concerns and diet history.   Progress:  Weight loss of 3.7lbs since previous visit on 02/13/20.  Started "Burn" exercise program with friend but has strained neck and has been unable to exercise for about 1 week now.   Has been diagnosed with carpal tunnel syndrome; sleep study results were normal  Family meals have improved since previous visit. Planning to order groceries online for pickup. Preparing extra meat portions to freeze for later use.   Eating smaller portions when dining out.   Typical eating pattern: Breakfast: [place] yogurt with granola; bagel thin with cream cheese and banana Snack: none Lunch: usually leftovers; mini tacos with lean beef + watermelon; salad with baked chicken Snack: none Supper: homemade meatball sub (1/2 bread); pork chops with honey mustard and rice; baked chicken with soy glaze;  + asparagus/ salad/ raw veggies + sometimes fruit ie watermelon Snack: occasionally ice cream (once in past week) Beverages: water + 1 diet drink daily, sometimes unsweetened tea   Education topics covered during this visit:  General nutrition/ Healthy eating  Exercise  Weight Concerns   Additional Comments: Offered ongoing support via phone or mychart messaging   Plan: Resume regular exercise once neck pain has resolved.  Continue with balanced meals, great job!

## 2020-03-14 ENCOUNTER — Ambulatory Visit: Payer: No Typology Code available for payment source | Admitting: Urology

## 2020-04-10 ENCOUNTER — Ambulatory Visit
Admission: RE | Admit: 2020-04-10 | Discharge: 2020-04-10 | Disposition: A | Discharge status: Home / Self Care | Payer: No Typology Code available for payment source | Source: Ambulatory Visit | Attending: Family Medicine | Admitting: Family Medicine

## 2020-04-10 ENCOUNTER — Other Ambulatory Visit: Payer: Self-pay

## 2020-04-10 DIAGNOSIS — D134 Benign neoplasm of liver: Secondary | ICD-10-CM | POA: Diagnosis present

## 2020-04-10 DIAGNOSIS — K7689 Other specified diseases of liver: Secondary | ICD-10-CM | POA: Insufficient documentation

## 2020-04-10 MED ORDER — GADOBUTROL 1 MMOL/ML IV SOLN
7.0000 mL | Freq: Once | INTRAVENOUS | Status: AC | PRN
  Administered 2020-04-10: 7 mL via INTRAVENOUS

## 2020-06-04 ENCOUNTER — Other Ambulatory Visit: Payer: Self-pay | Admitting: Neurology

## 2020-06-04 DIAGNOSIS — G939 Disorder of brain, unspecified: Secondary | ICD-10-CM

## 2020-06-04 DIAGNOSIS — R9089 Other abnormal findings on diagnostic imaging of central nervous system: Secondary | ICD-10-CM

## 2020-06-04 DIAGNOSIS — M5412 Radiculopathy, cervical region: Secondary | ICD-10-CM

## 2020-06-20 ENCOUNTER — Ambulatory Visit
Admission: RE | Admit: 2020-06-20 | Discharge: 2020-06-20 | Disposition: A | Discharge status: Home / Self Care | Payer: No Typology Code available for payment source | Source: Ambulatory Visit | Attending: Neurology | Admitting: Neurology

## 2020-06-20 ENCOUNTER — Other Ambulatory Visit: Payer: Self-pay

## 2020-06-20 DIAGNOSIS — R9089 Other abnormal findings on diagnostic imaging of central nervous system: Secondary | ICD-10-CM | POA: Insufficient documentation

## 2020-06-20 DIAGNOSIS — G939 Disorder of brain, unspecified: Secondary | ICD-10-CM

## 2020-06-20 DIAGNOSIS — M5412 Radiculopathy, cervical region: Secondary | ICD-10-CM | POA: Diagnosis present

## 2020-06-20 MED ORDER — GADOBUTROL 1 MMOL/ML IV SOLN
7.0000 mL | Freq: Once | INTRAVENOUS | Status: AC | PRN
  Administered 2020-06-20: 7 mL via INTRAVENOUS

## 2020-07-06 IMAGING — CR DG ABDOMEN 1V
2 series · 2 of 2 positions shown · non-contrast
Comparison: None.

CLINICAL DATA: Nephrolithiasis.

EXAM:
ABDOMEN - 1 VIEW

[abdomen kub (1 of 2)]
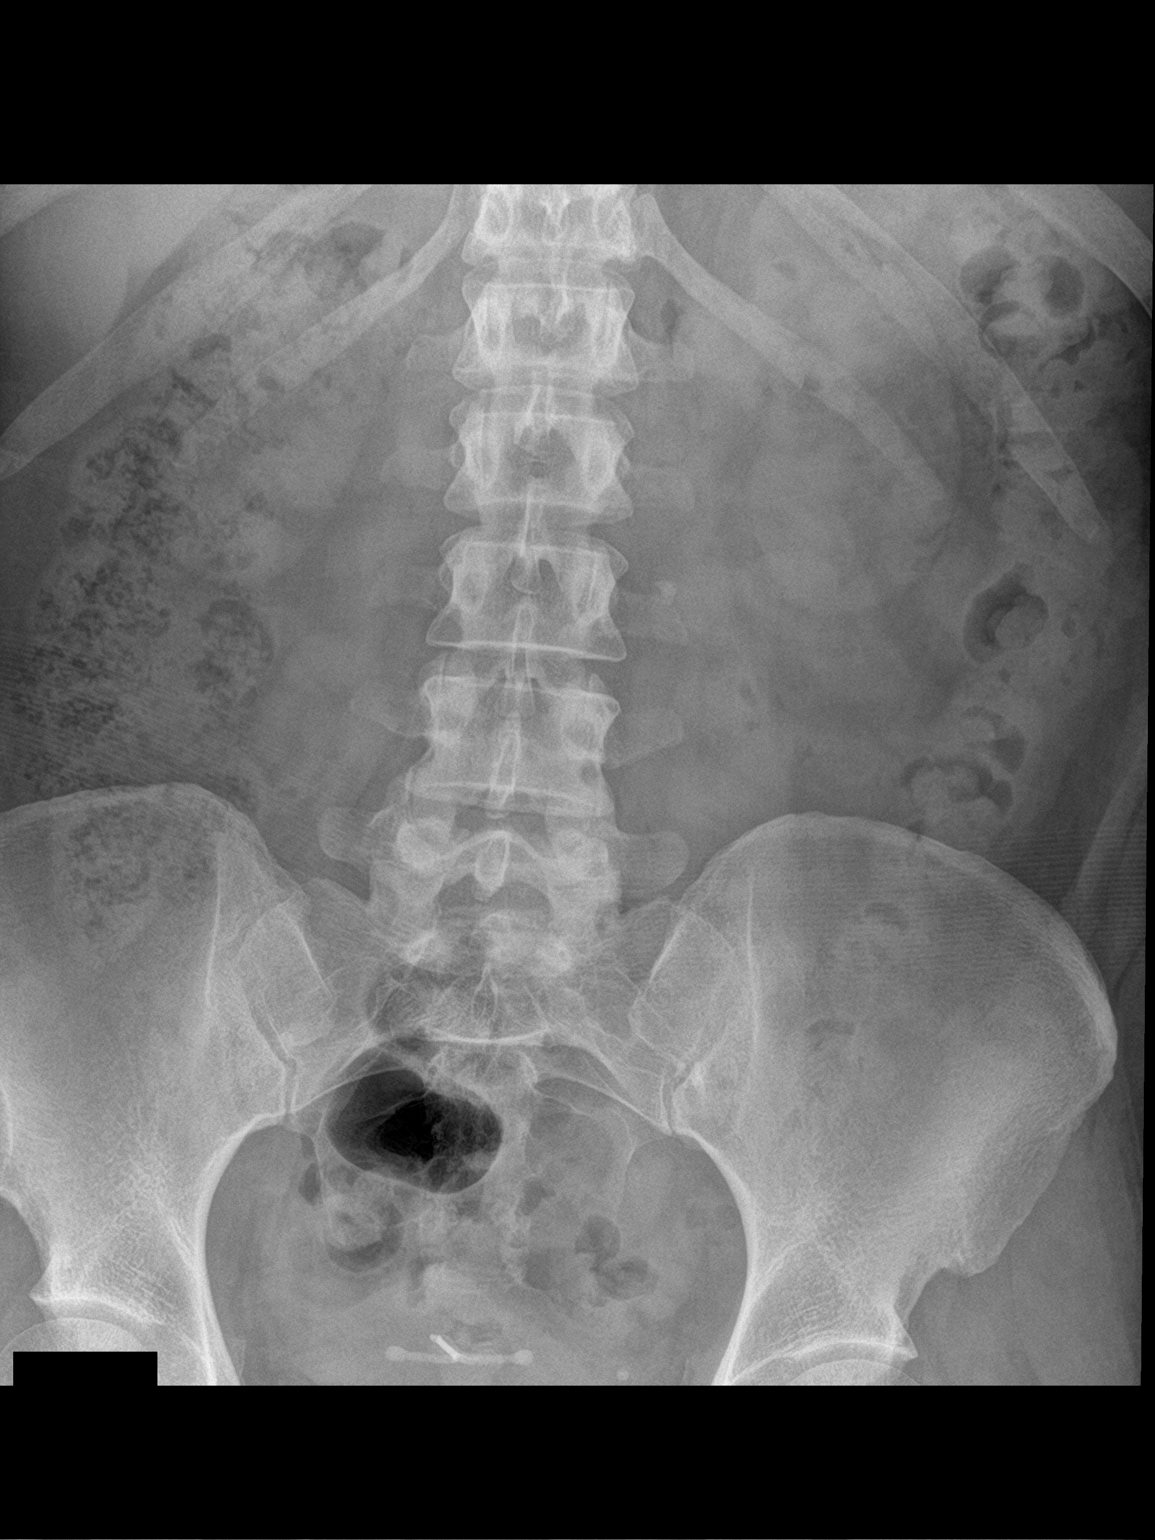

[abdomen kub (2 of 2)]
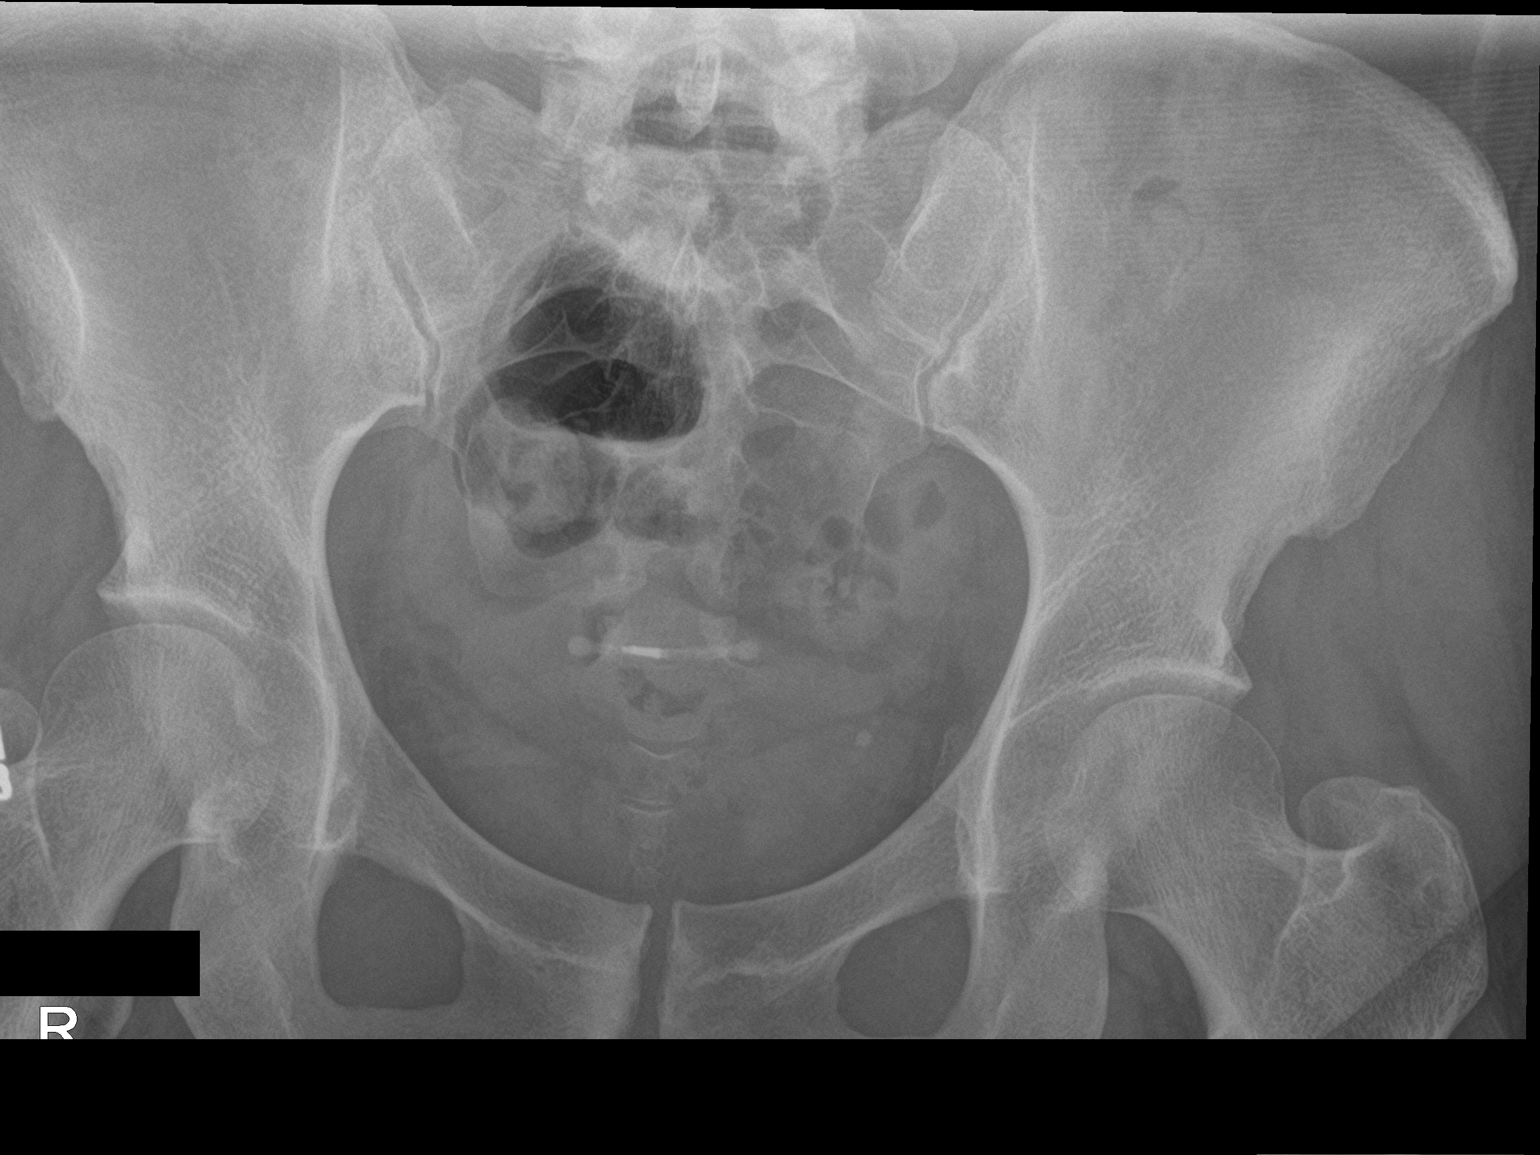

[2 of 2 positions shown; findings below may reference images not displayed]

FINDINGS: 5 mm calcification to the left of L3 (patient with 6 non-rib-bearing
lumbar vertebra, spinal levels from lower most non-rib-bearing
lumbar vertebra), in the region of the proximal ureter. Rounded
calcification in the left pelvis is the appearance of phleboliths.
IUD in the pelvis. Normal bowel gas pattern. Small to moderate
colonic stool burden.
IMPRESSION: 5 mm calcification projects over the region of the left proximal
ureter.

Left pelvic calcifications has the appearance of a phlebolith, but
may represent a distal ureteric stone.

## 2020-07-20 IMAGING — MR MR ABDOMEN WO/W CM
16 of 17 series · 44 of 48 positions shown · IV contrast (8 EOVIST)
Comparison: MR 04/26/2018

CLINICAL DATA: Followup liver mass identified on ultrasound

EXAM:
MRI ABDOMEN WITHOUT AND WITH CONTRAST
TECHNIQUE: Multiplanar multisequence MR imaging of the abdomen was performed
both before and after the administration of intravenous contrast.
CONTRAST:  8mL EOVIST GADOXETATE DISODIUM 0.25 MOL/L IV SOLN

[Series 4: cor ssfse / · coronal · 7.0mm · 1.48mm/px · 2 of 34 slices shown]
[im 1/34]
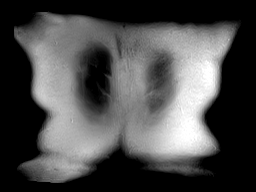
[im 34/34]
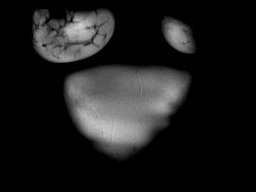

[Series 5: T1 · axial · 6.0mm · 0.74mm/px · z∈[-94,+166]mm · 4 of 74 slices shown]
[im 1/74]
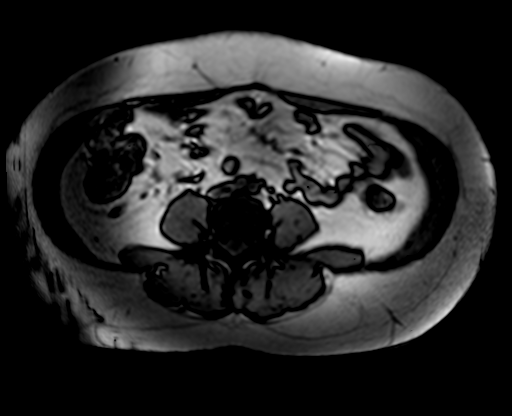
[im 25/74]
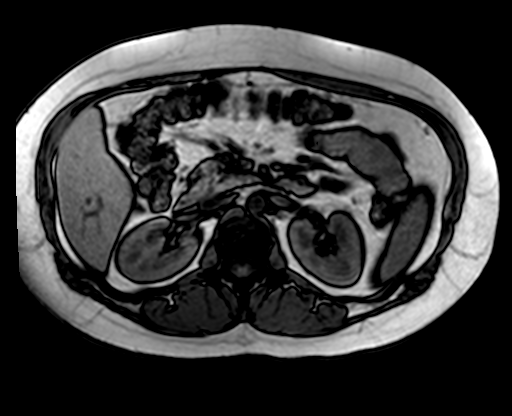
[im 49/74]
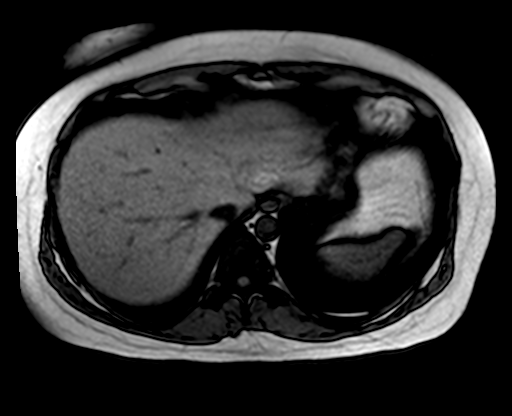
[im 74/74]
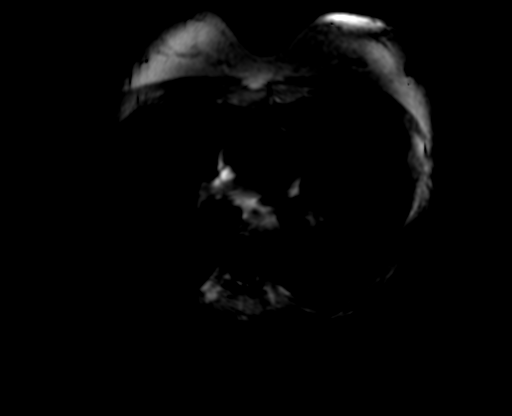

[Series 6: axial dynamic pre · axial · non-contrast · 3.0mm · 1.19mm/px · z∈[-94,+167]mm · 4 of 88 slices shown]
[im 1/88]
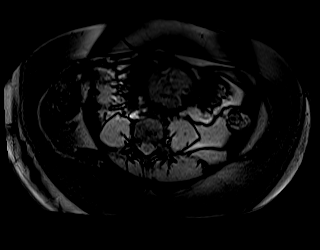
[im 30/88]
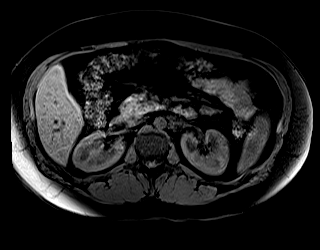
[im 59/88]
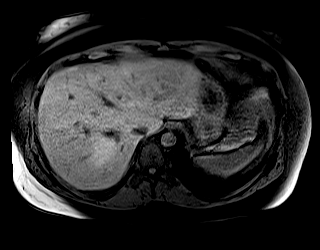
[im 88/88]
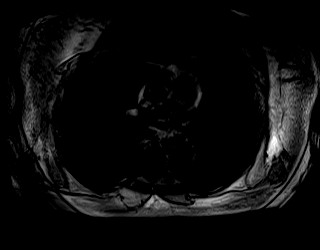

[Series 7: axial dynamic post · axial · 3.0mm · 1.19mm/px · z∈[-94,+167]mm · 3 of 88 slices shown (1 of 6)]
[im 1/88]
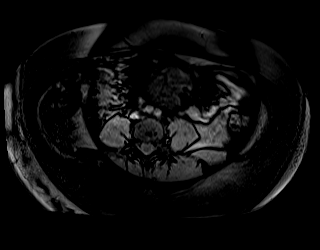
[im 44/88]
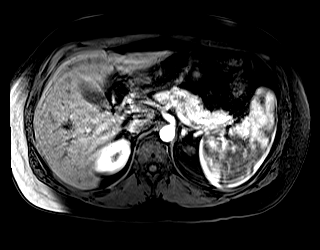
[im 88/88]
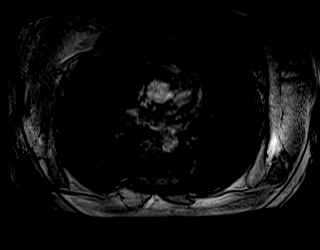

[Series 8: axial dynamic post · axial · 3.0mm · 1.19mm/px · z∈[-94,+167]mm · 3 of 88 slices shown (2 of 6)]
[im 1/88]
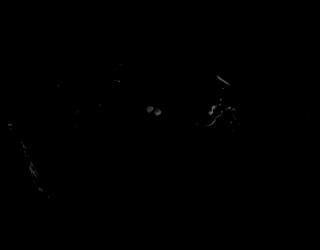
[im 44/88]
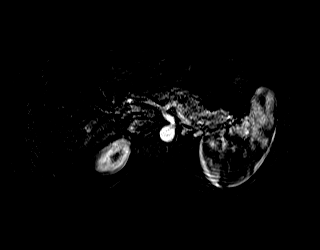
[im 88/88]
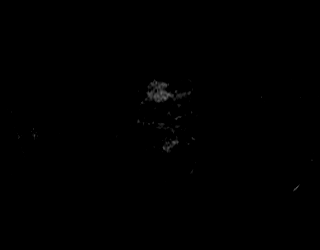

[Series 9: axial dynamic post · axial · 3.0mm · 1.19mm/px · z∈[-94,+167]mm · 3 of 88 slices shown (3 of 6)]
[im 1/88]
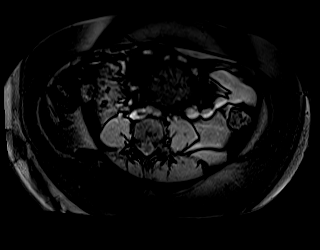
[im 44/88]
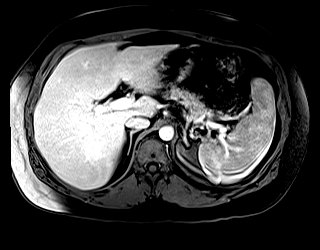
[im 88/88]
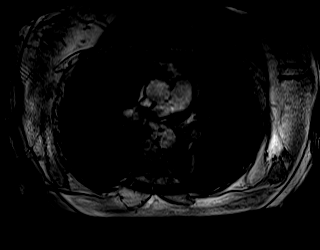

[Series 10: axial dynamic post · axial · 3.0mm · 1.19mm/px · z∈[-94,+167]mm · 3 of 88 slices shown (4 of 6)]
[im 1/88]
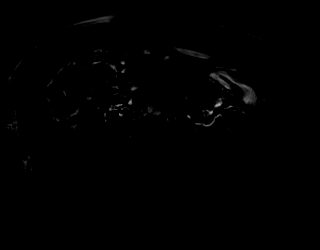
[im 44/88]
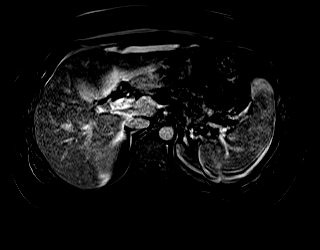
[im 88/88]
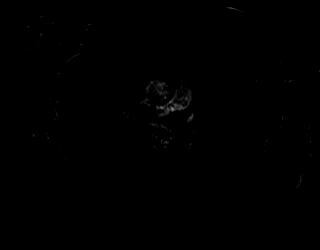

[Series 11: axial dynamic post · axial · 3.0mm · 1.19mm/px · z∈[-94,+167]mm · 3 of 88 slices shown (5 of 6)]
[im 1/88]
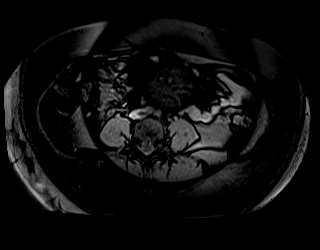
[im 44/88]
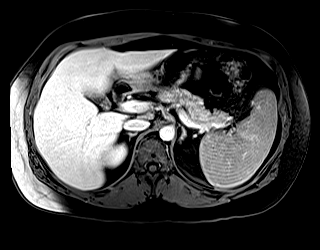
[im 88/88]
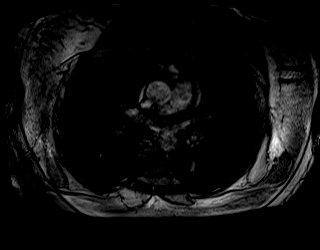

[Series 12: axial dynamic post · axial · 3.0mm · 1.19mm/px · z∈[-94,+167]mm · 3 of 88 slices shown (6 of 6)]
[im 1/88]
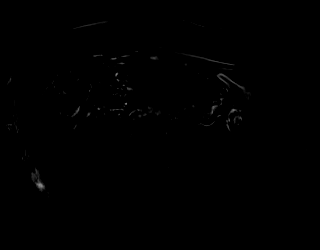
[im 44/88]
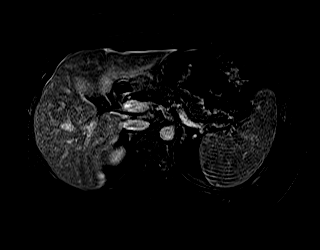
[im 88/88]
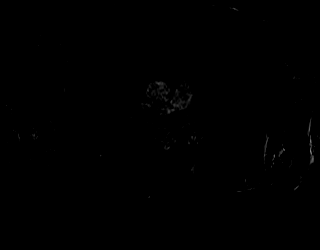

[Series 13: T2 fat-sat · axial · 6.0mm · 1.48mm/px · 1 of 37 slices shown]
[im 1/37]
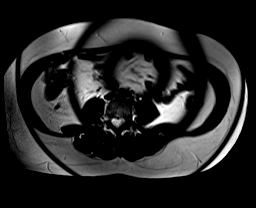

[Series 15: bSSFP · axial · 6.0mm · 0.74mm/px · 1 of 37 slices shown (1 of 2)]
[im 1/37]
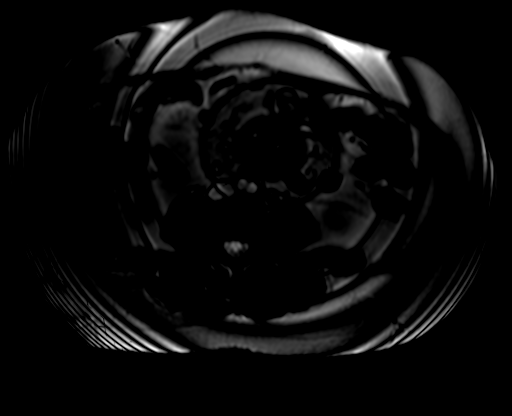

[Series 16: DWI · axial · 6.0mm · 2.00mm/px · z∈[-122,+194]mm · 5 of 134 slices shown (1 of 2)]
[im 1/134]
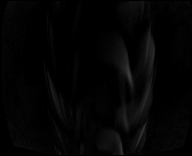
[im 34/134]
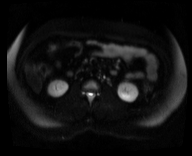
[im 67/134]
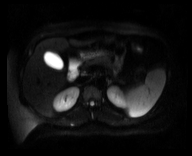
[im 100/134]
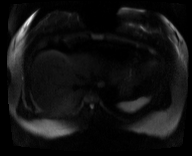
[im 134/134]
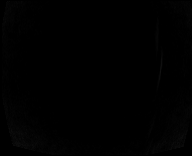

[Series 17: DWI · axial · 6.0mm · 2.00mm/px · z∈[-122,+194]mm · 2 of 45 slices shown (2 of 2)]
[im 1/45]
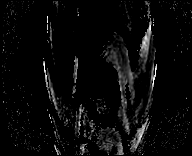
[im 45/45]
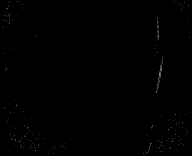

[Series 18: bSSFP · axial · 6.0mm · 0.74mm/px · 1 of 37 slices shown (2 of 2)]
[im 1/37]
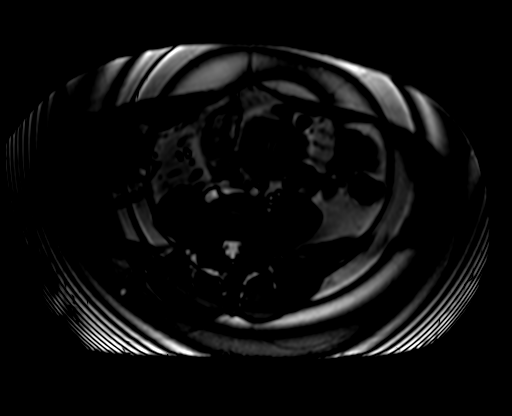

[Series 19: axial dynamic delayed · axial · 3.0mm · 1.19mm/px · z∈[-94,+167]mm · 3 of 88 slices shown (1 of 2)]
[im 1/88]
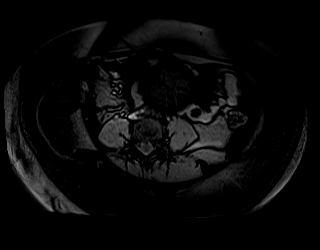
[im 44/88]
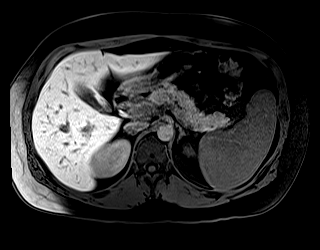
[im 88/88]
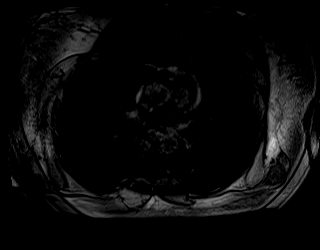

[Series 20: axial dynamic delayed · axial · 3.0mm · 1.19mm/px · z∈[-94,+167]mm · 3 of 88 slices shown (2 of 2)]
[im 1/88]
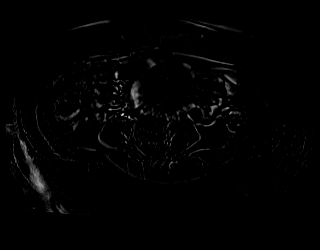
[im 44/88]
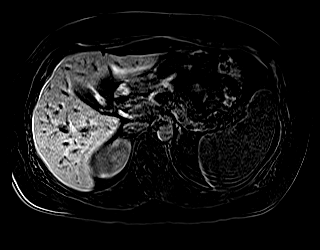
[im 88/88]
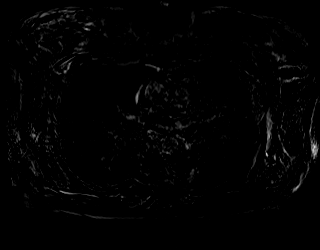

[44 of 48 positions shown; findings below may reference images not displayed]

FINDINGS: Lower chest: No acute findings.

Hepatobiliary: Right lobe of liver lesion is again identified
measuring 4.0 by 3.5 by 3.3 cm (volume = 24 cm^3). On the previous
exam this measured 5.0 x 4.8 by 4.5 cm (volume = 57 cm^3). This
lesion demonstrates avid arterial phase enhancement which becomes
isointense to liver on the 90 second delayed images and slightly
hypointense on the 20 minutes delayed images. No additional focal
liver abnormalities identified. The gallbladder appears within
normal limits. No gallstones, gallbladder wall thickening or biliary
dilatation.

Pancreas: No mass, inflammatory changes, or other parenchymal
abnormality identified.

Spleen:  Within normal limits in size and appearance.

Adrenals/Urinary Tract: No masses identified. No evidence of
hydronephrosis.

Stomach/Bowel: Visualized portions within the abdomen are
unremarkable.

Vascular/Lymphatic: No pathologically enlarged lymph nodes
identified. No abdominal aortic aneurysm demonstrated.

Other:  No free fluid or fluid collections.

Musculoskeletal: No suspicious bone lesions identified.
IMPRESSION: 1. The hyperenhancing liver lesion involving the right lobe is again
noted and is decreased in volume when compared with the previous
exam. In a patient that is low risk this is favored to represent a
benign abnormality, likely adenoma.
2. No additional liver lesions identified.

## 2020-08-07 IMAGING — CR DG ABDOMEN 1V
1 series · 1 of 1 positions shown · non-contrast
Comparison: 08/25/2018

CLINICAL DATA: Follow-up ureteral stone

EXAM:
ABDOMEN - 1 VIEW

[t abdomen supine]
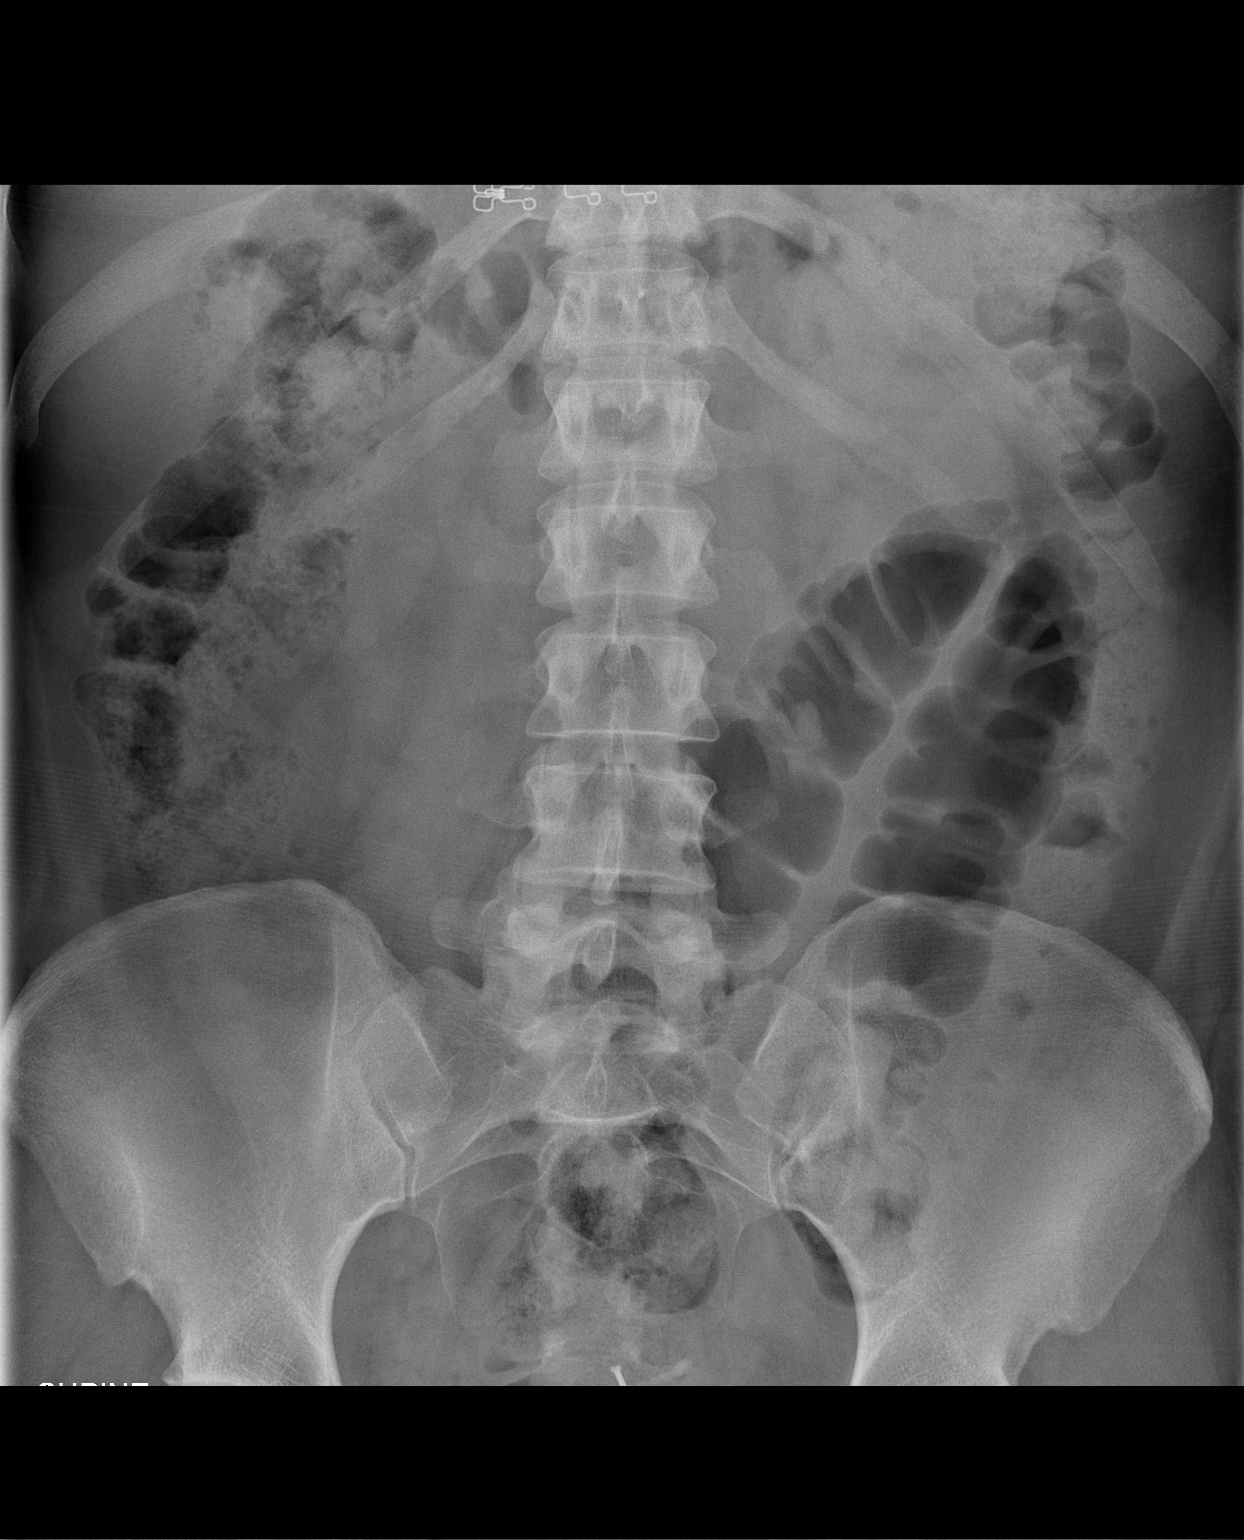

[1 of 1 positions shown; findings below may reference images not displayed]

FINDINGS: Scattered large and small bowel gas is noted. The previously seen
left ureteral stone is again identified adjacent to the L2
transverse process. IUD is noted in place. No other focal
abnormality is seen.
IMPRESSION: Stable appearance of left ureteral stone.

## 2020-09-09 ENCOUNTER — Other Ambulatory Visit: Payer: Self-pay | Admitting: Physical Medicine and Rehabilitation

## 2020-10-23 ENCOUNTER — Other Ambulatory Visit: Payer: Self-pay | Admitting: Neurology

## 2020-11-04 ENCOUNTER — Other Ambulatory Visit: Payer: Self-pay | Admitting: Family Medicine

## 2020-11-15 ENCOUNTER — Other Ambulatory Visit: Payer: Self-pay | Admitting: Neurology

## 2020-11-15 DIAGNOSIS — R202 Paresthesia of skin: Secondary | ICD-10-CM

## 2020-11-15 DIAGNOSIS — R200 Anesthesia of skin: Secondary | ICD-10-CM

## 2020-11-17 ENCOUNTER — Other Ambulatory Visit: Payer: Self-pay | Admitting: Neurology

## 2020-12-16 ENCOUNTER — Ambulatory Visit: Payer: No Typology Code available for payment source

## 2021-01-13 ENCOUNTER — Other Ambulatory Visit: Payer: Self-pay | Admitting: Physical Medicine and Rehabilitation

## 2021-02-26 ENCOUNTER — Other Ambulatory Visit: Payer: Self-pay | Admitting: Family Medicine

## 2021-02-26 DIAGNOSIS — D134 Benign neoplasm of liver: Secondary | ICD-10-CM

## 2021-03-02 ENCOUNTER — Other Ambulatory Visit: Payer: Self-pay

## 2021-03-02 MED ORDER — BUTALBITAL-APAP-CAFFEINE 50-325-40 MG PO TABS
1.0000 | ORAL_TABLET | ORAL | 0 refills | Status: DC | PRN
  Filled 2021-03-02: qty 30, 5d supply, fill #0

## 2021-03-03 ENCOUNTER — Ambulatory Visit: Payer: No Typology Code available for payment source

## 2021-03-03 ENCOUNTER — Other Ambulatory Visit: Payer: Self-pay

## 2021-03-03 MED ORDER — BUTALBITAL-APAP-CAFFEINE 50-325-40 MG PO TABS
ORAL_TABLET | ORAL | 0 refills | Status: DC
  Filled 2021-03-03: qty 30, 5d supply, fill #0

## 2021-03-03 MED FILL — Propranolol HCl Tab 10 MG: ORAL | 30 days supply | Qty: 60 | Fill #0 | Status: AC

## 2021-03-03 MED FILL — Rizatriptan Benzoate Oral Disintegrating Tab 10 MG (Base Eq): ORAL | 30 days supply | Qty: 10 | Fill #0 | Status: AC

## 2021-03-03 MED FILL — Gabapentin Cap 100 MG: ORAL | 30 days supply | Qty: 60 | Fill #0 | Status: AC

## 2021-03-05 ENCOUNTER — Other Ambulatory Visit: Payer: Self-pay

## 2021-03-05 MED ORDER — OFLOXACIN 0.3 % OP SOLN
OPHTHALMIC | 0 refills | Status: DC
  Filled 2021-03-05: qty 10, 18d supply, fill #0

## 2021-03-06 ENCOUNTER — Other Ambulatory Visit: Payer: Self-pay

## 2021-03-06 MED FILL — Propranolol HCl Tab 20 MG: ORAL | 90 days supply | Qty: 180 | Fill #0 | Status: AC

## 2021-03-11 ENCOUNTER — Other Ambulatory Visit: Payer: Self-pay

## 2021-03-11 DIAGNOSIS — G521 Disorders of glossopharyngeal nerve: Secondary | ICD-10-CM | POA: Insufficient documentation

## 2021-03-11 MED ORDER — OXCARBAZEPINE 150 MG PO TABS
ORAL_TABLET | ORAL | 3 refills | Status: DC
  Filled 2021-03-11: qty 360, 90d supply, fill #0

## 2021-03-11 MED ORDER — PROPRANOLOL HCL ER 60 MG PO CP24
ORAL_CAPSULE | ORAL | 11 refills | Status: DC
  Filled 2021-03-11: qty 30, 30d supply, fill #0

## 2021-03-21 ENCOUNTER — Other Ambulatory Visit: Payer: Self-pay

## 2021-03-21 MED ORDER — OZEMPIC (0.25 OR 0.5 MG/DOSE) 2 MG/1.5ML ~~LOC~~ SOPN
PEN_INJECTOR | SUBCUTANEOUS | 0 refills | Status: DC
  Filled 2021-03-21: qty 1.5, 28d supply, fill #0

## 2021-03-22 ENCOUNTER — Ambulatory Visit
Admission: RE | Admit: 2021-03-22 | Discharge: 2021-03-22 | Disposition: A | Discharge status: Home / Self Care | Payer: No Typology Code available for payment source | Source: Ambulatory Visit | Attending: Family Medicine | Admitting: Family Medicine

## 2021-03-22 ENCOUNTER — Ambulatory Visit
Admission: RE | Admit: 2021-03-22 | Discharge: 2021-03-22 | Disposition: A | Discharge status: Home / Self Care | Payer: No Typology Code available for payment source | Source: Ambulatory Visit | Attending: Neurology | Admitting: Neurology

## 2021-03-22 ENCOUNTER — Other Ambulatory Visit: Payer: Self-pay

## 2021-03-22 DIAGNOSIS — R202 Paresthesia of skin: Secondary | ICD-10-CM | POA: Insufficient documentation

## 2021-03-22 DIAGNOSIS — R200 Anesthesia of skin: Secondary | ICD-10-CM | POA: Diagnosis present

## 2021-03-22 DIAGNOSIS — D134 Benign neoplasm of liver: Secondary | ICD-10-CM | POA: Insufficient documentation

## 2021-03-22 MED ORDER — GADOXETATE DISODIUM 0.25 MMOL/ML IV SOLN
10.0000 mL | Freq: Once | INTRAVENOUS | Status: AC | PRN
  Administered 2021-03-22: 8 mL via INTRAVENOUS

## 2021-03-22 MED ORDER — GADOBUTROL 1 MMOL/ML IV SOLN
7.5000 mL | Freq: Once | INTRAVENOUS | Status: AC | PRN
  Administered 2021-03-22: 7.5 mL via INTRAVENOUS

## 2021-06-10 ENCOUNTER — Other Ambulatory Visit: Payer: Self-pay

## 2021-06-10 MED FILL — Propranolol HCl Tab 20 MG: ORAL | 90 days supply | Qty: 180 | Fill #1 | Status: AC

## 2021-06-10 MED FILL — Rizatriptan Benzoate Oral Disintegrating Tab 10 MG (Base Eq): ORAL | 30 days supply | Qty: 10 | Fill #1 | Status: AC

## 2021-06-10 MED FILL — Ondansetron HCl Tab 4 MG: ORAL | 7 days supply | Qty: 20 | Fill #0 | Status: AC

## 2021-06-16 ENCOUNTER — Other Ambulatory Visit: Payer: Self-pay

## 2021-06-16 MED ORDER — GABAPENTIN 100 MG PO CAPS
ORAL_CAPSULE | ORAL | 5 refills | Status: DC
  Filled 2021-06-16 – 2021-08-19 (×2): qty 60, 30d supply, fill #0
  Filled 2021-10-13: qty 60, 30d supply, fill #1
  Filled 2022-01-05: qty 60, 30d supply, fill #2
  Filled 2022-03-05: qty 60, 30d supply, fill #3
  Filled 2022-04-16: qty 60, 30d supply, fill #4

## 2021-06-30 ENCOUNTER — Other Ambulatory Visit: Payer: Self-pay

## 2021-07-09 ENCOUNTER — Other Ambulatory Visit: Payer: Self-pay

## 2021-07-09 MED ORDER — PROPRANOLOL HCL 20 MG PO TABS
ORAL_TABLET | ORAL | 3 refills | Status: DC
  Filled 2021-07-09 – 2021-10-13 (×2): qty 180, 90d supply, fill #0

## 2021-07-09 MED ORDER — ONDANSETRON 8 MG PO TBDP
ORAL_TABLET | ORAL | 5 refills | Status: DC
  Filled 2021-07-09: qty 20, 7d supply, fill #0
  Filled 2021-11-19: qty 20, 7d supply, fill #1
  Filled 2022-05-11: qty 20, 7d supply, fill #2

## 2021-07-09 MED ORDER — RIZATRIPTAN BENZOATE 10 MG PO TBDP
ORAL_TABLET | ORAL | 3 refills | Status: DC
  Filled 2021-07-09: qty 6, 15d supply, fill #0
  Filled 2021-11-19: qty 6, 15d supply, fill #1
  Filled 2022-01-05: qty 6, 15d supply, fill #2

## 2021-07-09 MED ORDER — PROPRANOLOL HCL 10 MG PO TABS
ORAL_TABLET | ORAL | 3 refills | Status: DC
  Filled 2021-07-09: qty 180, 90d supply, fill #0
  Filled 2021-10-13: qty 180, 90d supply, fill #1
  Filled 2022-02-10: qty 180, 90d supply, fill #2

## 2021-08-19 ENCOUNTER — Other Ambulatory Visit: Payer: Self-pay

## 2021-09-19 ENCOUNTER — Other Ambulatory Visit: Payer: Self-pay

## 2021-09-22 ENCOUNTER — Other Ambulatory Visit: Payer: Self-pay

## 2021-09-23 ENCOUNTER — Other Ambulatory Visit: Payer: Self-pay

## 2021-09-23 MED ORDER — CYANOCOBALAMIN 1000 MCG/ML IJ SOLN
INTRAMUSCULAR | 11 refills | Status: DC
  Filled 2021-09-23: qty 1, 28d supply, fill #0
  Filled 2021-10-13: qty 1, 28d supply, fill #1
  Filled 2021-11-17: qty 1, 28d supply, fill #2
  Filled 2022-01-05: qty 1, 28d supply, fill #3
  Filled 2022-02-10: qty 1, 28d supply, fill #4
  Filled 2022-03-05: qty 1, 28d supply, fill #5
  Filled 2022-04-16: qty 1, 28d supply, fill #6
  Filled 2022-05-05: qty 1, 30d supply, fill #7
  Filled 2022-06-03: qty 1, 30d supply, fill #8
  Filled 2022-07-06: qty 1, 30d supply, fill #9
  Filled 2022-09-07: qty 1, 30d supply, fill #10

## 2021-09-23 MED ORDER — "VANISHPOINT TUBERCULIN SYRINGE 25G X 1"" 1 ML MISC": 0 refills | Status: DC

## 2021-09-23 MED ORDER — PHENTERMINE HCL 37.5 MG PO TABS
ORAL_TABLET | ORAL | 0 refills | Status: DC
  Filled 2021-09-23: qty 30, 30d supply, fill #0

## 2021-10-13 ENCOUNTER — Other Ambulatory Visit: Payer: Self-pay

## 2021-10-14 ENCOUNTER — Other Ambulatory Visit: Payer: Self-pay

## 2021-10-15 ENCOUNTER — Other Ambulatory Visit: Payer: Self-pay

## 2021-10-17 ENCOUNTER — Other Ambulatory Visit: Payer: Self-pay

## 2021-10-17 MED ORDER — PHENTERMINE HCL 37.5 MG PO TABS
ORAL_TABLET | ORAL | 0 refills | Status: DC
  Filled 2021-10-17 – 2021-10-20 (×2): qty 30, 30d supply, fill #0

## 2021-10-20 ENCOUNTER — Other Ambulatory Visit: Payer: Self-pay

## 2021-11-12 ENCOUNTER — Other Ambulatory Visit: Payer: Self-pay

## 2021-11-12 ENCOUNTER — Ambulatory Visit (INDEPENDENT_AMBULATORY_CARE_PROVIDER_SITE_OTHER): Payer: No Typology Code available for payment source

## 2021-11-12 ENCOUNTER — Ambulatory Visit: Payer: No Typology Code available for payment source | Admitting: Podiatry

## 2021-11-12 ENCOUNTER — Encounter: Payer: Self-pay | Admitting: Podiatry

## 2021-11-12 ENCOUNTER — Other Ambulatory Visit: Payer: Self-pay | Admitting: Podiatry

## 2021-11-12 DIAGNOSIS — M722 Plantar fascial fibromatosis: Secondary | ICD-10-CM

## 2021-11-12 MED ORDER — MELOXICAM 15 MG PO TABS
15.0000 mg | ORAL_TABLET | Freq: Every day | ORAL | 3 refills | Status: DC
  Filled 2021-11-12: qty 30, 30d supply, fill #0

## 2021-11-12 MED ORDER — METHYLPREDNISOLONE 4 MG PO TBPK
ORAL_TABLET | ORAL | 0 refills | Status: DC
  Filled 2021-11-12: qty 21, 6d supply, fill #0

## 2021-11-12 MED ORDER — TRIAMCINOLONE ACETONIDE 40 MG/ML IJ SUSP
20.0000 mg | Freq: Once | INTRAMUSCULAR | Status: AC
  Administered 2021-11-12: 20 mg

## 2021-11-12 NOTE — Progress Notes (Signed)
Subjective:  Patient ID: Lindsay Parsons, female    DOB: Sep 04, 1986,  MRN: [id number] HPI Chief Complaint  Patient presents with   Foot Pain    Plantar heel left - aching x several months, AM pain, tried ice, exercises, PCP gave heel cups, pain is more constant now, usually more laterally   New Patient (Initial Visit)    36 y.o. female presents with the above complaint.   ROS: Denies fever chills nausea vomiting muscle aches pains calf pain back pain chest pain shortness of breath.  Past Medical History:  Diagnosis Date   Bulging of cervical intervertebral disc    Kidney stones    Renal calculi    SVT (supraventricular tachycardia) (HCC)    Past Surgical History:  Procedure Laterality Date   EXTRACORPOREAL SHOCK WAVE LITHOTRIPSY Left 08/25/2018   Procedure: EXTRACORPOREAL SHOCK WAVE LITHOTRIPSY (ESWL);  Surgeon: Abbie Sons, MD;  Location: [facility] ORS;  Service: Urology;  Laterality: Left;   FOOT SURGERY  2007   cyst removed from left foot   TONSILLECTOMY  1989    Current Outpatient Medications:    meloxicam (MOBIC) 15 MG tablet, Take 1 tablet (15 mg total) by mouth daily., Disp: 30 tablet, Rfl: 3   methylPREDNISolone (MEDROL DOSEPAK) 4 MG TBPK tablet, 6 day dose pack - take as directed, Disp: 21 tablet, Rfl: 0   cyanocobalamin (,VITAMIN B-12,) 1000 MCG/ML injection, Inject 1 mL (1,000 mcg total) into the muscle monthly, Disp: 1 mL, Rfl: 11   gabapentin (NEURONTIN) 100 MG capsule, 2 po qHS, Disp: 60 capsule, Rfl: 5   phentermine (ADIPEX-P) 37.5 MG tablet, Take 1 tablet by mouth before breakfast, Disp: 30 tablet, Rfl: 0   propranolol (INDERAL) 10 MG tablet, Take 1 tablet (10 mg total) by mouth 2 (two) times daily With 20 mg tablet for total of 30 mg twice daily, Disp: 180 tablet, Rfl: 3   propranolol (INDERAL) 20 MG tablet, TAKE 1 TABLET (20 MG TOTAL) BY MOUTH TWO TIMES DAILY, Disp: 180 tablet, Rfl: 3   rizatriptan (MAXALT-MLT) 10 MG disintegrating tablet, Take 1 tablet  (10 mg total) by mouth once as needed for Migraine for up to 1 dose, Disp: 6 tablet, Rfl: 3  Allergies  Allergen Reactions   Cefuroxime Axetil Dermatitis   Cephalosporins Hives   Other Itching    cephsporians   Review of Systems Objective:  There were no vitals filed for this visit.  General: Well developed, nourished, in no acute distress, alert and oriented x3   Dermatological: Skin is warm, dry and supple bilateral. Nails x 10 are well maintained; remaining integument appears unremarkable at this time. There are no open sores, no preulcerative lesions, no rash or signs of infection present.  Vascular: Dorsalis Pedis artery and Posterior Tibial artery pedal pulses are 2/4 bilateral with immedate capillary fill time. Pedal hair growth present. No varicosities and no lower extremity edema present bilateral.   Neruologic: Grossly intact via light touch bilateral. Vibratory intact via tuning fork bilateral. Protective threshold with Semmes Wienstein monofilament intact to all pedal sites bilateral. Patellar and Achilles deep tendon reflexes 2+ bilateral. No Babinski or clonus noted bilateral.   Musculoskeletal: No gross boney pedal deformities bilateral. No pain, crepitus, or limitation noted with foot and ankle range of motion bilateral. Muscular strength 5/5 in all groups tested bilateral.  Pain to palpation MucoClear tubercle of the left heel.  Gait: Unassisted, Nonantalgic.    Radiographs:  Radiographs taken today demonstrate small plantar distally oriented calcaneal  heel spur this osseously mature individual with a soft tissue increase in density at the plantar fascial cannula insertion site of the heel.  Assessment & Plan:   Assessment: Planter fasciitis left.  Plan: Discussed etiology pathology conservative versus surgical therapies.  I injected 20 mg of Kenalog 5 mg Marcaine to the point of maximal tenderness.  She tolerated procedure well start her on methylprednisolone to be  followed by meloxicam and dispensed a plantar fascial brace.  Discussed appropriate shoe gear stretching exercises ice therapy and shoe gear modifications.  Like to follow-up with her in 1 month if not 100%.     Jizel Cheeks T. [place], [place]

## 2021-11-12 NOTE — Patient Instructions (Signed)

## 2021-11-14 ENCOUNTER — Other Ambulatory Visit: Payer: Self-pay

## 2021-11-14 MED ORDER — PHENTERMINE HCL 37.5 MG PO TABS
ORAL_TABLET | ORAL | 0 refills | Status: DC
  Filled 2021-11-14 – 2021-11-17 (×2): qty 30, 30d supply, fill #0

## 2021-11-17 ENCOUNTER — Other Ambulatory Visit: Payer: Self-pay

## 2021-11-19 ENCOUNTER — Other Ambulatory Visit: Payer: Self-pay

## 2021-11-26 ENCOUNTER — Telehealth: Payer: No Typology Code available for payment source | Admitting: Family

## 2021-11-26 DIAGNOSIS — J019 Acute sinusitis, unspecified: Secondary | ICD-10-CM

## 2021-11-26 MED ORDER — DOXYCYCLINE HYCLATE 100 MG PO TABS: 100.0000 mg | ORAL_TABLET | Freq: Two times a day (BID) | ORAL | 0 refills | Status: DC

## 2021-11-26 NOTE — Progress Notes (Signed)

## 2021-12-01 ENCOUNTER — Telehealth: Payer: No Typology Code available for payment source | Admitting: Emergency Medicine

## 2021-12-01 DIAGNOSIS — B9689 Other specified bacterial agents as the cause of diseases classified elsewhere: Secondary | ICD-10-CM

## 2021-12-01 DIAGNOSIS — J019 Acute sinusitis, unspecified: Secondary | ICD-10-CM | POA: Diagnosis not present

## 2021-12-01 MED ORDER — AMOXICILLIN-POT CLAVULANATE 875-125 MG PO TABS: 1.0000 | ORAL_TABLET | Freq: Two times a day (BID) | ORAL | 0 refills | Status: DC

## 2021-12-01 NOTE — Progress Notes (Signed)
E-Visit for Sinus Problems  We are sorry that you are not feeling well.  Here is how we plan to help!  Based on what you have shared with me it looks like you have sinusitis.  Sinusitis is inflammation and infection in the sinus cavities of the head.  Based on your presentation I believe you most likely have Acute Bacterial Sinusitis.  This is an infection caused by bacteria and is treated with antibiotics. I have prescribed Augmentin 875mg /125mg  one tablet twice daily with food, for 7 days as you requested. Please STOP taking doxycycline now.  If you have difficulty with augmentin and/or your symptoms continue, you will need to be seen in person for care.   You may use an oral decongestant such as Mucinex D or if you have glaucoma or high blood pressure use plain Mucinex. Saline nasal spray help and can safely be used as often as needed for congestion.  If you develop worsening sinus pain, fever or notice severe headache and vision changes, or if symptoms are not better after completion of antibiotic, please schedule an appointment with a health care provider.    Sinus infections are not as easily transmitted as other respiratory infection, however we still recommend that you avoid close contact with loved ones, especially the very young and elderly.  Remember to wash your hands thoroughly throughout the day as this is the number one way to prevent the spread of infection!  Home Care: Only take medications as instructed by your medical team. Complete the entire course of an antibiotic. Do not take these medications with alcohol. A steam or ultrasonic humidifier can help congestion.  You can place a towel over your head and breathe in the steam from hot water coming from a faucet. Avoid close contacts especially the very young and the elderly. Cover your mouth when you cough or sneeze. Always remember to wash your hands.  Get Help Right Away If: You develop worsening fever or sinus pain. You  develop a severe head ache or visual changes. Your symptoms persist after you have completed your treatment plan.  Make sure you Understand these instructions. Will watch your condition. Will get help right away if you are not doing well or get worse.  Thank you for choosing an e-visit.  Your e-visit answers were reviewed by a board certified advanced clinical practitioner to complete your personal care plan. Depending upon the condition, your plan could have included both over the counter or prescription medications.  Please review your pharmacy choice. Make sure the pharmacy is open so you can pick up prescription now. If there is a problem, you may contact your provider through [employer] and have the prescription routed to another pharmacy.  Your safety is important to [place]. If you have drug allergies check your prescription carefully.   For the next 24 hours you can use MyChart to ask questions about today's visit, request a non-urgent call back, or ask for a work or school excuse. You will get an email in the next two days asking about your experience. I hope that your e-visit has been valuable and will speed your recovery.  I have spent 5 minutes in review of e-visit questionnaire, review and updating patient chart, medical decision making and response to patient.   Willeen Cass, PhD, FNP-BC

## 2021-12-08 ENCOUNTER — Encounter: Payer: Self-pay | Admitting: Podiatry

## 2021-12-08 NOTE — Telephone Encounter (Signed)
Please advise 

## 2021-12-12 ENCOUNTER — Other Ambulatory Visit: Payer: Self-pay

## 2021-12-12 MED ORDER — PHENTERMINE HCL 37.5 MG PO TABS
ORAL_TABLET | ORAL | 0 refills | Status: DC
  Filled 2021-12-12 – 2021-12-15 (×2): qty 30, 30d supply, fill #0

## 2021-12-15 ENCOUNTER — Other Ambulatory Visit: Payer: Self-pay

## 2021-12-16 ENCOUNTER — Other Ambulatory Visit: Payer: Self-pay | Admitting: General Surgery

## 2021-12-19 ENCOUNTER — Ambulatory Visit: Payer: No Typology Code available for payment source | Admitting: Podiatrist

## 2021-12-23 LAB — SURGICAL PATHOLOGY

## 2021-12-24 ENCOUNTER — Ambulatory Visit: Payer: No Typology Code available for payment source | Admitting: Podiatry

## 2022-01-05 ENCOUNTER — Other Ambulatory Visit: Payer: Self-pay

## 2022-01-09 ENCOUNTER — Other Ambulatory Visit: Payer: Self-pay

## 2022-01-09 MED ORDER — PHENTERMINE HCL 37.5 MG PO TABS
ORAL_TABLET | ORAL | 0 refills | Status: DC
  Filled 2022-01-12: qty 30, 30d supply, fill #0

## 2022-01-12 ENCOUNTER — Other Ambulatory Visit: Payer: Self-pay

## 2022-01-22 ENCOUNTER — Telehealth: Payer: Self-pay

## 2022-01-22 ENCOUNTER — Other Ambulatory Visit: Payer: Self-pay

## 2022-01-22 ENCOUNTER — Ambulatory Visit
Admission: RE | Admit: 2022-01-22 | Discharge: 2022-01-22 | Disposition: A | Discharge status: Home / Self Care | Payer: No Typology Code available for payment source | Source: Ambulatory Visit | Attending: Surgery | Admitting: Surgery

## 2022-01-22 ENCOUNTER — Other Ambulatory Visit
Admission: AD | Admit: 2022-01-22 | Discharge: 2022-01-22 | Disposition: A | Discharge status: Home / Self Care | Payer: No Typology Code available for payment source | Source: Ambulatory Visit | Attending: Surgery | Admitting: Surgery

## 2022-01-22 DIAGNOSIS — R1011 Right upper quadrant pain: Secondary | ICD-10-CM | POA: Insufficient documentation

## 2022-01-22 DIAGNOSIS — R109 Unspecified abdominal pain: Secondary | ICD-10-CM | POA: Insufficient documentation

## 2022-01-22 LAB — CBC WITH DIFFERENTIAL/PLATELET
Abs Immature Granulocytes: 0.03 10*3/uL (ref 0.00–0.07)
Basophils Absolute: 0 10*3/uL (ref 0.0–0.1)
Basophils Relative: 1 %
Eosinophils Absolute: 0.1 10*3/uL (ref 0.0–0.5)
Eosinophils Relative: 2 %
HCT: 43.5 % (ref 36.0–46.0)
Hemoglobin: 15 g/dL (ref 12.0–15.0)
Immature Granulocytes: 0 %
Lymphocytes Relative: 27 %
Lymphs Abs: 2.3 10*3/uL (ref 0.7–4.0)
MCH: 31.6 pg (ref 26.0–34.0)
MCHC: 34.5 g/dL (ref 30.0–36.0)
MCV: 91.8 fL (ref 80.0–100.0)
Monocytes Absolute: 1 10*3/uL (ref 0.1–1.0)
Monocytes Relative: 12 %
Neutro Abs: 5.1 10*3/uL (ref 1.7–7.7)
Neutrophils Relative %: 58 %
Platelets: 294 10*3/uL (ref 150–400)
RBC: 4.74 MIL/uL (ref 3.87–5.11)
RDW: 12 % (ref 11.5–15.5)
WBC: 8.5 10*3/uL (ref 4.0–10.5)
nRBC: 0 % (ref 0.0–0.2)

## 2022-01-22 LAB — COMPREHENSIVE METABOLIC PANEL
ALT: 21 U/L (ref 0–44)
AST: 22 U/L (ref 15–41)
Albumin: 3.9 g/dL (ref 3.5–5.0)
Alkaline Phosphatase: 48 U/L (ref 38–126)
Anion gap: 9 (ref 5–15)
BUN: 16 mg/dL (ref 6–20)
CO2: 27 mmol/L (ref 22–32)
Calcium: 9.1 mg/dL (ref 8.9–10.3)
Chloride: 103 mmol/L (ref 98–111)
Creatinine, Ser: 0.84 mg/dL (ref 0.44–1.00)
GFR, Estimated: >60 mL/min (ref >60)
Glucose, Bld: 90 mg/dL (ref 70–99)
Potassium: 4.2 mmol/L (ref 3.5–5.1)
Sodium: 139 mmol/L (ref 135–145)
Total Bilirubin: 0.9 mg/dL (ref 0.3–1.2)
Total Protein: 7.2 g/dL (ref 6.5–8.1)

## 2022-01-22 MED ORDER — METOCLOPRAMIDE HCL 10 MG PO TABS
ORAL_TABLET | ORAL | 0 refills | Status: DC
  Filled 2022-01-22: qty 40, 10d supply, fill #0

## 2022-01-22 NOTE — Telephone Encounter (Signed)
RUQ [place] scheduled today.  ?Please go to the lab to have blood drawn for labs.  ?We will call you with results. ?

## 2022-02-10 ENCOUNTER — Other Ambulatory Visit: Payer: Self-pay

## 2022-02-10 MED ORDER — PROPRANOLOL HCL 20 MG PO TABS
ORAL_TABLET | ORAL | 1 refills | Status: DC
  Filled 2022-02-10: qty 270, 90d supply, fill #0
  Filled 2022-06-03: qty 270, 90d supply, fill #1

## 2022-02-11 ENCOUNTER — Other Ambulatory Visit: Payer: Self-pay

## 2022-02-18 ENCOUNTER — Other Ambulatory Visit: Payer: Self-pay

## 2022-02-18 MED ORDER — PHENTERMINE HCL 37.5 MG PO TABS
ORAL_TABLET | ORAL | 0 refills | Status: DC
  Filled 2022-02-18: qty 30, 30d supply, fill #0

## 2022-02-19 ENCOUNTER — Other Ambulatory Visit: Payer: Self-pay

## 2022-02-20 ENCOUNTER — Other Ambulatory Visit: Payer: Self-pay

## 2022-02-20 MED ORDER — PREDNISONE 10 MG PO TABS
ORAL_TABLET | ORAL | 0 refills | Status: DC
Start: 2022-02-20 — End: 2022-03-05
  Filled 2022-02-20: qty 14, 6d supply, fill #0

## 2022-02-23 ENCOUNTER — Other Ambulatory Visit: Payer: Self-pay

## 2022-02-23 MED ORDER — PHENDIMETRAZINE TARTRATE 35 MG PO TABS
35.0000 mg | ORAL_TABLET | Freq: Three times a day (TID) | ORAL | 0 refills | Status: DC
  Filled 2022-02-23: qty 90, 30d supply, fill #0

## 2022-02-23 MED ORDER — TIZANIDINE HCL 4 MG PO TABS
ORAL_TABLET | ORAL | 1 refills | Status: DC
  Filled 2022-02-23: qty 30, 30d supply, fill #0

## 2022-02-24 ENCOUNTER — Other Ambulatory Visit: Payer: Self-pay

## 2022-02-25 ENCOUNTER — Other Ambulatory Visit: Payer: Self-pay

## 2022-03-04 ENCOUNTER — Other Ambulatory Visit: Payer: Self-pay

## 2022-03-05 ENCOUNTER — Other Ambulatory Visit: Payer: Self-pay

## 2022-03-05 MED ORDER — PREDNISONE 10 MG PO TABS
ORAL_TABLET | ORAL | 0 refills | Status: DC
  Filled 2022-03-05: qty 42, 12d supply, fill #0

## 2022-03-10 ENCOUNTER — Other Ambulatory Visit (HOSPITAL_COMMUNITY): Payer: Self-pay | Admitting: Orthopedic Surgery

## 2022-03-17 ENCOUNTER — Other Ambulatory Visit: Payer: Self-pay

## 2022-03-17 MED ORDER — LIDOCAINE 5 % EX PTCH
MEDICATED_PATCH | CUTANEOUS | 1 refills | Status: DC
  Filled 2022-03-17: qty 3, 3d supply, fill #0
  Filled 2022-03-17: qty 27, 27d supply, fill #0

## 2022-03-18 ENCOUNTER — Encounter (HOSPITAL_BASED_OUTPATIENT_CLINIC_OR_DEPARTMENT_OTHER): Payer: Self-pay | Admitting: Orthopedic Surgery

## 2022-03-18 ENCOUNTER — Other Ambulatory Visit: Payer: Self-pay

## 2022-03-19 ENCOUNTER — Other Ambulatory Visit: Payer: Self-pay

## 2022-03-26 ENCOUNTER — Ambulatory Visit (HOSPITAL_BASED_OUTPATIENT_CLINIC_OR_DEPARTMENT_OTHER)
Admission: RE | Admit: 2022-03-26 | Discharge: 2022-03-26 | Disposition: A | Discharge status: Home / Self Care | Payer: No Typology Code available for payment source | Attending: Orthopedic Surgery | Admitting: Orthopedic Surgery

## 2022-03-26 ENCOUNTER — Ambulatory Visit (HOSPITAL_BASED_OUTPATIENT_CLINIC_OR_DEPARTMENT_OTHER): Payer: No Typology Code available for payment source | Admitting: Anesthesiology

## 2022-03-26 ENCOUNTER — Other Ambulatory Visit: Payer: Self-pay

## 2022-03-26 ENCOUNTER — Encounter (HOSPITAL_BASED_OUTPATIENT_CLINIC_OR_DEPARTMENT_OTHER): Payer: Self-pay | Admitting: Orthopedic Surgery

## 2022-03-26 ENCOUNTER — Encounter (HOSPITAL_BASED_OUTPATIENT_CLINIC_OR_DEPARTMENT_OTHER)
Admission: RE | Disposition: A | Discharge status: Home / Self Care | Payer: Self-pay | Source: Home / Self Care | Attending: Orthopedic Surgery

## 2022-03-26 DIAGNOSIS — I10 Essential (primary) hypertension: Secondary | ICD-10-CM | POA: Insufficient documentation

## 2022-03-26 DIAGNOSIS — M722 Plantar fascial fibromatosis: Secondary | ICD-10-CM

## 2022-03-26 DIAGNOSIS — M6702 Short Achilles tendon (acquired), left ankle: Secondary | ICD-10-CM

## 2022-03-26 HISTORY — DX: Migraine, unspecified, not intractable, without status migrainosus: G43.909

## 2022-03-26 HISTORY — PX: GASTROC RECESSION EXTREMITY: SHX6262

## 2022-03-26 LAB — POCT PREGNANCY, URINE: Preg Test, Ur: NEGATIVE

## 2022-03-26 SURGERY — RECESSION, TENDON, GASTROCNEMIUS
Anesthesia: General | Site: Leg Lower | Laterality: Left

## 2022-03-26 MED ORDER — CLINDAMYCIN PHOSPHATE 900 MG/50ML IV SOLN
900.0000 mg | Freq: Once | INTRAVENOUS | Status: AC
  Administered 2022-03-26: 900 mg via INTRAVENOUS

## 2022-03-26 MED ORDER — ONDANSETRON HCL 4 MG/2ML IJ SOLN: 4.0000 mg | Freq: Four times a day (QID) | INTRAMUSCULAR | Status: DC | PRN

## 2022-03-26 MED ORDER — LIDOCAINE HCL (CARDIAC) PF 100 MG/5ML IV SOSY
PREFILLED_SYRINGE | INTRAVENOUS | Status: DC | PRN
  Administered 2022-03-26: 60 mg via INTRAVENOUS

## 2022-03-26 MED ORDER — CEFAZOLIN SODIUM-DEXTROSE 2-4 GM/100ML-% IV SOLN: 2.0000 g | INTRAVENOUS | Status: DC

## 2022-03-26 MED ORDER — OXYCODONE HCL 5 MG PO TABS: 5.0000 mg | ORAL_TABLET | Freq: Four times a day (QID) | ORAL | 0 refills | Status: AC | PRN

## 2022-03-26 MED ORDER — CLINDAMYCIN PHOSPHATE 900 MG/50ML IV SOLN
INTRAVENOUS | Status: AC
  Filled 2022-03-26: qty 50

## 2022-03-26 MED ORDER — FENTANYL CITRATE (PF) 100 MCG/2ML IJ SOLN
100.0000 ug | Freq: Once | INTRAMUSCULAR | Status: AC
  Administered 2022-03-26: 50 ug via INTRAVENOUS

## 2022-03-26 MED ORDER — PROPOFOL 10 MG/ML IV BOLUS
INTRAVENOUS | Status: DC | PRN
  Administered 2022-03-26: 200 mg via INTRAVENOUS

## 2022-03-26 MED ORDER — VANCOMYCIN HCL 500 MG IV SOLR
INTRAVENOUS | Status: DC | PRN
  Administered 2022-03-26: 500 mg

## 2022-03-26 MED ORDER — ACETAMINOPHEN 10 MG/ML IV SOLN
INTRAVENOUS | Status: AC
  Filled 2022-03-26: qty 100

## 2022-03-26 MED ORDER — MIDAZOLAM HCL 2 MG/2ML IJ SOLN
2.0000 mg | Freq: Once | INTRAMUSCULAR | Status: AC
  Administered 2022-03-26: 2 mg via INTRAVENOUS

## 2022-03-26 MED ORDER — FENTANYL CITRATE (PF) 100 MCG/2ML IJ SOLN
INTRAMUSCULAR | Status: AC
  Filled 2022-03-26: qty 2

## 2022-03-26 MED ORDER — DEXAMETHASONE SODIUM PHOSPHATE 4 MG/ML IJ SOLN
INTRAMUSCULAR | Status: DC | PRN
Start: 2022-03-26 — End: 2022-03-26
  Administered 2022-03-26: 5 mg via INTRAVENOUS

## 2022-03-26 MED ORDER — ACETAMINOPHEN 10 MG/ML IV SOLN
1000.0000 mg | Freq: Once | INTRAVENOUS | Status: AC
  Administered 2022-03-26: 1000 mg via INTRAVENOUS

## 2022-03-26 MED ORDER — OXYCODONE HCL 5 MG/5ML PO SOLN: 5.0000 mg | Freq: Once | ORAL | Status: DC | PRN

## 2022-03-26 MED ORDER — SODIUM CHLORIDE 0.9 % IV SOLN: INTRAVENOUS | Status: DC

## 2022-03-26 MED ORDER — 0.9 % SODIUM CHLORIDE (POUR BTL) OPTIME
TOPICAL | Status: DC | PRN
  Administered 2022-03-26: 120 mL

## 2022-03-26 MED ORDER — FENTANYL CITRATE (PF) 100 MCG/2ML IJ SOLN
25.0000 ug | INTRAMUSCULAR | Status: DC | PRN
  Administered 2022-03-26: 50 ug via INTRAVENOUS

## 2022-03-26 MED ORDER — CEFAZOLIN SODIUM-DEXTROSE 2-4 GM/100ML-% IV SOLN
INTRAVENOUS | Status: AC
Start: 2022-03-26 — End: ?
  Filled 2022-03-26: qty 100

## 2022-03-26 MED ORDER — BUPIVACAINE-EPINEPHRINE (PF) 0.5% -1:200000 IJ SOLN
INTRAMUSCULAR | Status: DC | PRN
  Administered 2022-03-26: 30 mL via PERINEURAL

## 2022-03-26 MED ORDER — MIDAZOLAM HCL 2 MG/2ML IJ SOLN
INTRAMUSCULAR | Status: AC
  Filled 2022-03-26: qty 2

## 2022-03-26 MED ORDER — OXYCODONE HCL 5 MG PO TABS: 5.0000 mg | ORAL_TABLET | Freq: Once | ORAL | Status: DC | PRN

## 2022-03-26 MED ORDER — LACTATED RINGERS IV SOLN: INTRAVENOUS | Status: DC

## 2022-03-26 MED ORDER — VANCOMYCIN HCL 500 MG IV SOLR
INTRAVENOUS | Status: AC
  Filled 2022-03-26: qty 10

## 2022-03-26 SURGICAL SUPPLY — 62 items
APL PRP STRL LF DISP 70% ISPRP (MISCELLANEOUS) ×2
BANDAGE ESMARK 6X9 LF (GAUZE/BANDAGES/DRESSINGS) IMPLANT
BLADE SURG 15 STRL LF DISP TIS (BLADE) ×3 IMPLANT
BLADE SURG 15 STRL SS (BLADE) ×3
BNDG CMPR 5X6 CHSV STRCH STRL (GAUZE/BANDAGES/DRESSINGS)
BNDG CMPR 9X6 STRL LF SNTH (GAUZE/BANDAGES/DRESSINGS)
BNDG COHESIVE 4X5 TAN ST LF (GAUZE/BANDAGES/DRESSINGS) IMPLANT
BNDG COHESIVE 6X5 TAN ST LF (GAUZE/BANDAGES/DRESSINGS) IMPLANT
BNDG ELASTIC 4X5.8 VLCR STR LF (GAUZE/BANDAGES/DRESSINGS) IMPLANT
BNDG ESMARK 6X9 LF (GAUZE/BANDAGES/DRESSINGS)
BOOT STEPPER DURA LG (SOFTGOODS) IMPLANT
BOOT STEPPER DURA MED (SOFTGOODS) IMPLANT
BOOT STEPPER DURA XLG (SOFTGOODS) IMPLANT
CHLORAPREP W/TINT 26 (MISCELLANEOUS) ×4 IMPLANT
COVER BACK TABLE 60X90IN (DRAPES) ×4 IMPLANT
CUFF TOURN SGL QUICK 34 (TOURNIQUET CUFF)
CUFF TRNQT CYL 34X4.125X (TOURNIQUET CUFF) IMPLANT
DRAPE EXTREMITY T 121X128X90 (DISPOSABLE) ×4 IMPLANT
DRAPE U-SHAPE 47X51 STRL (DRAPES) ×4 IMPLANT
DRSG MEPITEL 4X7.2 (GAUZE/BANDAGES/DRESSINGS) ×4 IMPLANT
DRSG PAD ABDOMINAL 8X10 ST (GAUZE/BANDAGES/DRESSINGS) ×4 IMPLANT
ELECT REM PT RETURN 9FT ADLT (ELECTROSURGICAL) ×3
ELECTRODE REM PT RTRN 9FT ADLT (ELECTROSURGICAL) ×3 IMPLANT
GAUZE SPONGE 4X4 12PLY STRL (GAUZE/BANDAGES/DRESSINGS) ×4 IMPLANT
GLOVE BIO SURGEON STRL SZ7 (GLOVE) ×2 IMPLANT
GLOVE BIO SURGEON STRL SZ8 (GLOVE) ×4 IMPLANT
GLOVE BIOGEL PI IND STRL 7.0 (GLOVE) ×1 IMPLANT
GLOVE BIOGEL PI IND STRL 8 (GLOVE) ×6 IMPLANT
GLOVE BIOGEL PI INDICATOR 7.0 (GLOVE) ×1
GLOVE BIOGEL PI INDICATOR 8 (GLOVE) ×2
GLOVE ECLIPSE 8.0 STRL XLNG CF (GLOVE) ×4 IMPLANT
GOWN STRL REUS W/ TWL LRG LVL3 (GOWN DISPOSABLE) ×3 IMPLANT
GOWN STRL REUS W/ TWL XL LVL3 (GOWN DISPOSABLE) ×6 IMPLANT
GOWN STRL REUS W/TWL LRG LVL3 (GOWN DISPOSABLE) ×3
GOWN STRL REUS W/TWL XL LVL3 (GOWN DISPOSABLE) ×6
NDL HYPO 25X1 1.5 SAFETY (NEEDLE) IMPLANT
NEEDLE HYPO 22GX1.5 SAFETY (NEEDLE) IMPLANT
NEEDLE HYPO 25X1 1.5 SAFETY (NEEDLE) IMPLANT
NS IRRIG 1000ML POUR BTL (IV SOLUTION) ×4 IMPLANT
PACK BASIN DAY SURGERY FS (CUSTOM PROCEDURE TRAY) ×4 IMPLANT
PAD CAST 4YDX4 CTTN HI CHSV (CAST SUPPLIES) ×3 IMPLANT
PADDING CAST COTTON 4X4 STRL (CAST SUPPLIES) ×3
PADDING CAST COTTON 6X4 STRL (CAST SUPPLIES) IMPLANT
PENCIL SMOKE EVACUATOR (MISCELLANEOUS) ×4 IMPLANT
SANITIZER HAND PURELL 535ML FO (MISCELLANEOUS) ×4 IMPLANT
SHEET MEDIUM DRAPE 40X70 STRL (DRAPES) ×4 IMPLANT
SPLINT FAST PLASTER 5X30 (CAST SUPPLIES)
SPLINT PLASTER CAST FAST 5X30 (CAST SUPPLIES) IMPLANT
SPONGE T-LAP 18X18 ~~LOC~~+RFID (SPONGE) ×4 IMPLANT
STOCKINETTE 6  STRL (DRAPES) ×3
STOCKINETTE 6 STRL (DRAPES) ×3 IMPLANT
SUCTION FRAZIER HANDLE 10FR (MISCELLANEOUS)
SUCTION TUBE FRAZIER 10FR DISP (MISCELLANEOUS) IMPLANT
SUT ETHILON 3 0 PS 1 (SUTURE) ×4 IMPLANT
SUT MNCRL AB 3-0 PS2 18 (SUTURE) IMPLANT
SUT VICRYL 0 SH 27 (SUTURE) IMPLANT
SYR BULB EAR ULCER 3OZ GRN STR (SYRINGE) ×4 IMPLANT
SYR CONTROL 10ML LL (SYRINGE) IMPLANT
TOWEL GREEN STERILE FF (TOWEL DISPOSABLE) ×4 IMPLANT
TUBE CONNECTING 20X1/4 (TUBING) IMPLANT
UNDERPAD 30X36 HEAVY ABSORB (UNDERPADS AND DIAPERS) ×4 IMPLANT
YANKAUER SUCT BULB TIP NO VENT (SUCTIONS) IMPLANT

## 2022-03-26 NOTE — Op Note (Signed)
03/26/2022  2:29 PM  PATIENT:  Lindsay Parsons  36 y.o. female  PRE-OPERATIVE DIAGNOSIS:  1.  Chronic left plantar fasciitis      2.  Short left achilles tendon  POST-OPERATIVE DIAGNOSIS: same  Procedure(s): 1.  Left gastrocnemius recession   2.  Left plantar fascia release (separate incision)  SURGEON:  Wylene Simmer, MD  ASSISTANT:  none  ANESTHESIA:   General, regional  EBL:  minimal   TOURNIQUET:   Total Tourniquet Time Documented: Thigh (Left) - 21 minutes Total: Thigh (Left) - 21 minutes  COMPLICATIONS:  None apparent  DISPOSITION:  Extubated, awake and stable to recovery.  INDICATION FOR PROCEDURE: 36 year old female with chronic left heel pain due to plantar fasciitis.  She also has a tight heel cord.  She has failed nonoperative treatment to date and presents now for left gastrocnemius recession and plantar fascial release.  The risks and benefits of the alternative treatment options have been discussed in detail.  The patient wishes to proceed with surgery and specifically understands risks of bleeding, infection, nerve damage, blood clots, need for additional surgery, amputation and death.   PROCEDURE IN DETAIL: After preoperative consent was obtained and the correct operative site was identified, the patient was brought to the operating room and placed supine on the operating table.  General anesthesia was induced.  Preoperative antibiotics were administered.  Surgical timeout was taken.  The left lower extremity was prepped and draped in standard sterile fashion with a tourniquet around the thigh.  The extremity was elevated and the tourniquet was inflated to 250 mmHg.  An incision was then made over the medial calf.  Dissection was carried sharply down through the subcutaneous tissues.  Care was taken to protect the saphenous nerve and vein.  The superficial fascia was incised.  The gastrocnemius tendon was identified.  It was separated from the soleus fascia.  The  gastrocnemius tendon was then divided under direct vision taking care to protect the sural nerve posteriorly.  The wound was irrigated copiously.  Subcutaneous tissues were approximated with Monocryl.  The skin incision was closed with nylon after sprinkling with vancomycin powder.  Attention was turned to the medial arch.  An incision was made just distal from the plantar heel pad along the medial border of the plantar fascia.  Dissection was carried sharply down through the subcutaneous tissues.  The plantar fascia was identified.  The deep surface was dissected.  A Beaver blade was used to divide the medial cord to approximately half the width of the plantar fascia.  The wound was irrigated copiously.  The wound was sprinkled with vancomycin powder and closed with horizontal mattress sutures of 3-0 nylon.  Sterile dressings were applied followed by a well-padded short leg splint.  The tourniquet was released after application of the dressings.  The patient was awakened from anesthesia and transported to the recovery room in stable condition.   FOLLOW UP PLAN: Nonweightbearing on the left lower extremity.  Follow-up in the office in 2 weeks for suture removal and conversion to a cam boot for early range of motion and weightbearing.  Aspirin for DVT prophylaxis.

## 2022-03-26 NOTE — Anesthesia Procedure Notes (Signed)
Procedure Name: LMA Insertion Date/Time: 03/26/2022 1:46 PM Performed by: Glory Buff, CRNA Pre-anesthesia Checklist: Patient identified, Emergency Drugs available, Suction available and Patient being monitored Patient Re-evaluated:Patient Re-evaluated prior to induction Oxygen Delivery Method: Circle system utilized Preoxygenation: Pre-oxygenation with 100% oxygen Induction Type: IV induction LMA: LMA inserted LMA Size: 4.0 Number of attempts: 1 Placement Confirmation: positive ETCO2 Tube secured with: Tape Dental Injury: Teeth and Oropharynx as per pre-operative assessment

## 2022-03-26 NOTE — Anesthesia Preprocedure Evaluation (Addendum)
Anesthesia Evaluation  Patient identified by MRN, date of birth, ID band Patient awake    Reviewed: Allergy & Precautions, H&P , NPO status , Patient's Chart, lab work & pertinent test results  Airway Mallampati: II   Neck ROM: full    Dental   Pulmonary neg pulmonary ROS,    breath sounds clear to auscultation       Cardiovascular hypertension, + dysrhythmias Supra Ventricular Tachycardia  Rhythm:regular Rate:Normal     Neuro/Psych  Headaches,    GI/Hepatic   Endo/Other    Renal/GU Renal diseasestones     Musculoskeletal   Abdominal   Peds  Hematology   Anesthesia Other Findings   Reproductive/Obstetrics                             Anesthesia Physical Anesthesia Plan  ASA: 2  Anesthesia Plan: General   Post-op Pain Management: Regional block*   Induction: Intravenous  PONV Risk Score and Plan: 3 and Ondansetron, Dexamethasone, Midazolam and Treatment may vary due to age or medical condition  Airway Management Planned: Oral ETT  Additional Equipment:   Intra-op Plan:   Post-operative Plan: Extubation in OR  Informed Consent: I have reviewed the patients History and Physical, chart, labs and discussed the procedure including the risks, benefits and alternatives for the proposed anesthesia with the patient or authorized representative who has indicated his/her understanding and acceptance.     Dental advisory given  Plan Discussed with: CRNA, Anesthesiologist and Surgeon  Anesthesia Plan Comments:         Anesthesia Quick Evaluation

## 2022-03-26 NOTE — Progress Notes (Signed)
Assisted Dr. Marcie Bal with left, popliteal, ultrasound guided block. Side rails up, monitors on throughout procedure. See vital signs in flow sheet. Tolerated Procedure well.

## 2022-03-26 NOTE — Anesthesia Procedure Notes (Signed)
Anesthesia Regional Block: Popliteal block   Pre-Anesthetic Checklist: , timeout performed,  Correct Patient, Correct Site, Correct Laterality,  Correct Procedure, Correct Position, site marked,  Risks and benefits discussed,  Surgical consent,  Pre-op evaluation,  At surgeon's request and post-op pain management  Laterality: Left  Prep: chloraprep       Needles:  Injection technique: Single-shot  Needle Type: Echogenic Stimulator Needle          Additional Needles:   Procedures:, nerve stimulator,,,,,     Nerve Stimulator or Paresthesia:  Response: plantar flexion of foot, 0.45 mA  Additional Responses:   Narrative:  Start time: 03/26/2022 12:44 PM End time: 03/26/2022 12:56 PM Injection made incrementally with aspirations every 5 mL.  Performed by: Personally  Anesthesiologist: Albertha Ghee, MD  Additional Notes: Functioning IV was confirmed and monitors were applied.  A 65m 21ga Arrow echogenic stimulator needle was used. Sterile prep and drape,hand hygiene and sterile gloves were used.  Negative aspiration and negative test dose prior to incremental administration of local anesthetic. The patient tolerated the procedure well.  Ultrasound guidance: relevent anatomy identified, needle position confirmed, local anesthetic spread visualized around nerve(s), vascular puncture avoided.  Image printed for medical record.

## 2022-03-26 NOTE — Discharge Instructions (Addendum)
Lindsay Simmer, MD [facility]  Please read the following information regarding your care after surgery.  Medications  You only need a prescription for the narcotic pain medicine (ex. oxycodone, Percocet, Norco).  All of the other medicines listed below are available over the counter. X Aleve 2 pills twice a day for the first 3 days after surgery. X acetominophen (Tylenol) 650 mg every 4-6 hours as you need for minor to moderate pain X oxycodone as prescribed for severe pain  Narcotic pain medicine (ex. oxycodone, Percocet, Vicodin) will cause constipation.  To prevent this problem, take the following medicines while you are taking any pain medicine. X docusate sodium (Colace) 100 mg twice a day X senna (Senokot) 2 tablets twice a day  X To help prevent blood clots, take a baby aspirin (81 mg) twice a day for two weeks after surgery.  You should also get up every hour while you are awake to move around.    Weight Bearing X Do not bear any weight on the operated leg or foot.  Cast / Splint / Dressing X Keep your splint, cast or dressing clean and dry.  Don't put anything (coat hanger, pencil, etc) down inside of it.  If it gets damp, use a hair dryer on the cool setting to dry it.  If it gets soaked, call the office to schedule an appointment for a cast change.  After your dressing, cast or splint is removed; you may shower, but do not soak or scrub the wound.  Allow the water to run over it, and then gently pat it dry.  Swelling It is normal for you to have swelling where you had surgery.  To reduce swelling and pain, keep your toes above your nose for at least 3 days after surgery.  It may be necessary to keep your foot or leg elevated for several weeks.  If it hurts, it should be elevated.  Follow Up Call my office at [phone] when you are discharged from the hospital or surgery center to schedule an appointment to be seen two weeks after surgery.  Call my office at [phone] if  you develop a fever >101.5 F, nausea, vomiting, bleeding from the surgical site or severe pain.      May take Tylenol after 8:45pm, if needed.   Post Anesthesia Home Care Instructions  Activity: Get plenty of rest for the remainder of the day. A responsible individual must stay with you for 24 hours following the procedure.  For the next 24 hours, DO NOT: -Drive a car -Paediatric nurse -Drink alcoholic beverages -Take any medication unless instructed by your physician -Make any legal decisions or sign important papers.  Meals: Start with liquid foods such as gelatin or soup. Progress to regular foods as tolerated. Avoid greasy, spicy, heavy foods. If nausea and/or vomiting occur, drink only clear liquids until the nausea and/or vomiting subsides. Call your physician if vomiting continues.  Special Instructions/Symptoms: Your throat may feel dry or sore from the anesthesia or the breathing tube placed in your throat during surgery. If this causes discomfort, gargle with warm salt water. The discomfort should disappear within 24 hours.  If you had a scopolamine patch placed behind your ear for the management of post- operative nausea and/or vomiting:  1. The medication in the patch is effective for 72 hours, after which it should be removed.  Wrap patch in a tissue and discard in the trash. Wash hands thoroughly with soap and water. 2. You may remove  the patch earlier than 72 hours if you experience unpleasant side effects which may include dry mouth, dizziness or visual disturbances. 3. Avoid touching the patch. Wash your hands with soap and water after contact with the patch.    Regional Anesthesia Blocks  1. Numbness or the inability to move the "blocked" extremity may last from 3-48 hours after placement. The length of time depends on the medication injected and your individual response to the medication. If the numbness is not going away after 48 hours, call your surgeon.  2. The  extremity that is blocked will need to be protected until the numbness is gone and the  Strength has returned. Because you cannot feel it, you will need to take extra care to avoid injury. Because it may be weak, you may have difficulty moving it or using it. You may not know what position it is in without looking at it while the block is in effect.  3. For blocks in the legs and feet, returning to weight bearing and walking needs to be done carefully. You will need to wait until the numbness is entirely gone and the strength has returned. You should be able to move your leg and foot normally before you try and bear weight or walk. You will need someone to be with you when you first try to ensure you do not fall and possibly risk injury.  4. Bruising and tenderness at the needle site are common side effects and will resolve in a few days.  5. Persistent numbness or new problems with movement should be communicated to the surgeon or the [facility] [phone] [place] [phone]).

## 2022-03-26 NOTE — Transfer of Care (Signed)
Immediate Anesthesia Transfer of Care Note  Patient: Lindsay Parsons  Procedure(s) Performed: GASTROC RECESSION AND PLANTAR FASCIA RELEASE (Left: Leg Lower)  Patient Location: PACU  Anesthesia Type:General and Regional  Level of Consciousness: drowsy and patient cooperative  Airway & Oxygen Therapy: Patient Spontanous Breathing and Patient connected to face mask oxygen  Post-op Assessment: Report given to RN and Post -op Vital signs reviewed and stable  Post vital signs: Reviewed and stable  Last Vitals:  Vitals Value Taken Time  BP    Temp    Pulse 71 03/26/22 1425  Resp 17 03/26/22 1425  SpO2 100 % 03/26/22 1425  Vitals shown include unvalidated device data.  Last Pain:  Vitals:   03/26/22 1220  TempSrc: Oral  PainSc: 3       Patients Stated Pain Goal: 3 (93/81/82 9937)  Complications: No notable events documented.

## 2022-03-26 NOTE — H&P (Signed)
Lindsay Parsons is an 36 y.o. female.   Chief Complaint: left heel pain HPI: 36 year old female with a long history of left heel pain due to plantar fasciitis.  She has a tight heel cord as well.  She has failed nonoperative treatment to date including activity modification, oral anti-inflammatories, steroid injections, physical therapy and immobilization.  She presents now for plantar fascia release and gastrocnemius recession.  Past Medical History:  Diagnosis Date   Bulging of cervical intervertebral disc    Kidney stones    Migraines    Renal calculi    SVT (supraventricular tachycardia) (HCC)     Past Surgical History:  Procedure Laterality Date   EXTRACORPOREAL SHOCK WAVE LITHOTRIPSY Left 08/25/2018   Procedure: EXTRACORPOREAL SHOCK WAVE LITHOTRIPSY (ESWL);  Surgeon: Abbie Sons, MD;  Location: [facility] ORS;  Service: Urology;  Laterality: Left;   FOOT SURGERY  2007   cyst removed from left foot   TONSILLECTOMY  1989    Family History  Problem Relation Age of Onset   Cancer Father    Hyperlipidemia Father    Cancer Maternal Grandmother    Cancer Maternal Grandfather    Cancer Paternal Grandfather    Hypertension Paternal Grandfather    Social History:  reports that she has never smoked. She has never used smokeless tobacco. She reports current alcohol use. She reports that she does not use drugs.  Allergies:  Allergies  Allergen Reactions   Cefuroxime Axetil Dermatitis   Cephalosporins Hives and Itching    Medications Prior to Admission  Medication Sig Dispense Refill   cyanocobalamin (,VITAMIN B-12,) 1000 MCG/ML injection Inject 1 mL (1,000 mcg total) into the muscle monthly 1 mL 11   gabapentin (NEURONTIN) 100 MG capsule 2 po qHS 60 capsule 5   phentermine (ADIPEX-P) 37.5 MG tablet Take 1 tablet (37.5 mg total) by mouth every morning before breakfast for 30 days 30 tablet 0   propranolol (INDERAL) 20 MG tablet TAKE 1 TABLET (20 MG TOTAL) BY MOUTH TWO TIMES  DAILY (Patient taking differently: Take 30 mg by mouth 2 (two) times daily.) 180 tablet 3   rizatriptan (MAXALT-MLT) 10 MG disintegrating tablet Take 1 tablet (10 mg total) by mouth once as needed for Migraine for up to 1 dose 6 tablet 3   tiZANidine (ZANAFLEX) 4 MG tablet 1/2-1 po HS prn 30 tablet 1   lidocaine (LIDODERM) 5 % 1 patch 12 hours on, 12 hours off 30 patch 1   metoCLOPramide (REGLAN) 10 MG tablet Take 1 tablet (10 mg total) by mouth 4 (four) times daily for 10 days 40 tablet 0   ondansetron (ZOFRAN-ODT) 8 MG disintegrating tablet Take 1 tablet (8 mg total) by mouth every 8 (eight) hours as needed for Nausea for up to 7 days 20 tablet 5   Phendimetrazine Tartrate 35 MG TABS Take 1 tablet (35 mg total) by mouth 3 (three) times daily 1 hour before meals. 90 tablet 0   predniSONE (DELTASONE) 10 MG tablet '60mg'$  x 2 days, '50mg'$  x 2 days, '40mg'$  x 2 days, '30mg'$  x 2 days, '20mg'$  x 2 days, '10mg'$  x 2 days 42 tablet 0   propranolol (INDERAL) 10 MG tablet Take 1 tablet (10 mg total) by mouth 2 (two) times daily With 20 mg tablet for total of 30 mg twice daily (Patient taking differently: 30 mg 2 (two) times daily.) 180 tablet 3   propranolol (INDERAL) 20 MG tablet Take 1.5 tablets (30 mg total) by mouth 2 (two) times  daily for 90 days 270 tablet 1    Results for orders placed or performed during the hospital encounter of 04-03-22 (from the past 48 hour(s))  Pregnancy, urine POC     Status: None   Collection Time: 04-03-2022 12:06 PM  Result Value Ref Range   Preg Test, Ur NEGATIVE NEGATIVE    Comment:        THE SENSITIVITY OF THIS METHODOLOGY IS >24 mIU/mL    No results found.  Review of Systems no recent fever, chills, nausea, vomiting or changes in her appetite Blood pressure 113/85, pulse 68, temperature 98.1 F (36.7 C), temperature source Oral, resp. rate 19, height '5\' 3"'$  (1.6 m), weight 75.7 kg, last menstrual period 03/14/2022, SpO2 98 %. Physical Exam  Well-nourished well-developed woman  in no apparent distress.  Alert and oriented x4.  Normal mood and affect.  Left heel is tender to palpation at the origin of the plantar fascia.  Skin is healthy and intact.  Pulses are palpable.  No lymphadenopathy.  5 out of 5 strength in plantarflexion.   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  The risks and benefits of the alternative treatment options have been discussed in detail.  The patient wishes to proceed with surgery and specifically understands risks of bleeding, infection, nerve damage, blood clots, need for additional surgery, amputation and death.   Wylene Simmer, MD 2022-04-03, 1:25 PM

## 2022-03-27 NOTE — Anesthesia Postprocedure Evaluation (Signed)
Anesthesia Post Note  Patient: Lindsay Parsons  Procedure(s) Performed: GASTROC RECESSION AND PLANTAR FASCIA RELEASE (Left: Leg Lower)     Patient location during evaluation: PACU Anesthesia Type: General and Regional Level of consciousness: awake and alert Pain management: pain level controlled Vital Signs Assessment: post-procedure vital signs reviewed and stable Respiratory status: spontaneous breathing, nonlabored ventilation, respiratory function stable and patient connected to nasal cannula oxygen Cardiovascular status: blood pressure returned to baseline and stable Postop Assessment: no apparent nausea or vomiting Anesthetic complications: no   No notable events documented.  Last Vitals:  Vitals:   03/26/22 1430 03/26/22 1445  BP: 108/71 126/85  Pulse: 69 83  Resp: 16 15  Temp:    SpO2: 100% 97%    Last Pain:  Vitals:   03/26/22 1445  TempSrc:   PainSc: 4    Pain Goal: Patients Stated Pain Goal: 3 (03/26/22 1220)                 [place]

## 2022-03-29 ENCOUNTER — Encounter (HOSPITAL_BASED_OUTPATIENT_CLINIC_OR_DEPARTMENT_OTHER): Payer: Self-pay | Admitting: Orthopedic Surgery

## 2022-04-06 ENCOUNTER — Telehealth: Payer: No Typology Code available for payment source | Admitting: Physician Assistant

## 2022-04-06 DIAGNOSIS — R21 Rash and other nonspecific skin eruption: Secondary | ICD-10-CM

## 2022-04-06 MED ORDER — PREDNISONE 10 MG PO TABS: ORAL_TABLET | ORAL | 0 refills | Status: DC

## 2022-04-06 NOTE — Progress Notes (Signed)
E Visit for Rash  We are sorry that you are not feeling well. Here is how we plan to help!  Based on what you shared with me it looks like you have contact dermatitis.  Contact dermatitis is a skin rash caused by something that touches the skin and causes irritation or inflammation.  Your skin may be red, swollen, dry, cracked, and itch.  The rash should go away in a few days but can last a few weeks.  If you get a rash, it's important to figure out what caused it so the irritant can be avoided in the future. and I am prescribing a two week course of steroids (37 tablets of 10 mg prednisone).  Days 1-4 take 4 tablets (40 mg) daily  Days 5-8 take 3 tablets (30 mg) daily, Days 9-11 take 2 tablets (20 mg) daily, Days 12-14 take 1 tablet (10 mg) daily.   I recommend you take Benadryl 25 mg - 50 mg every 4 hours to control the symptoms but if they last over 24 hours it is best that you see an office based provider for follow up.   HOME CARE:  Take cool showers and avoid direct sunlight. Apply cool compress or wet dressings. Take a bath in an oatmeal bath.  Sprinkle content of one Aveeno packet under running faucet with comfortably warm water.  Bathe for 15-20 minutes, 1-2 times daily.  Pat dry with a towel. Do not rub the rash. Use hydrocortisone cream. Take an antihistamine like Benadryl for widespread rashes that itch.  The adult dose of Benadryl is 25-50 mg by mouth 4 times daily. Caution:  This type of medication may cause sleepiness.  Do not drink alcohol, drive, or operate dangerous machinery while taking antihistamines.  Do not take these medications if you have prostate enlargement.  Read package instructions thoroughly on all medications that you take.  GET HELP RIGHT AWAY IF:  Symptoms don't go away after treatment. Severe itching that persists. If you rash spreads or swells. If you rash begins to smell. If it blisters and opens or develops a yellow-brown crust. You develop a  fever. You have a sore throat. You become short of breath.  MAKE SURE YOU:  Understand these instructions. Will watch your condition. Will get help right away if you are not doing well or get worse.  Thank you for choosing an e-visit.  Your e-visit answers were reviewed by a board certified advanced clinical practitioner to complete your personal care plan. Depending upon the condition, your plan could have included both over the counter or prescription medications.  Please review your pharmacy choice. Make sure the pharmacy is open so you can pick up prescription now. If there is a problem, you may contact your provider through [employer] and have the prescription routed to another pharmacy.  Your safety is important to [place]. If you have drug allergies check your prescription carefully.   For the next 24 hours you can use MyChart to ask questions about today's visit, request a non-urgent call back, or ask for a work or school excuse. You will get an email in the next two days asking about your experience. I hope that your e-visit has been valuable and will speed your recovery.   I provided 5 minutes of non face-to-face time during this encounter for chart review and documentation.

## 2022-04-06 NOTE — Progress Notes (Signed)
Duplicate

## 2022-04-14 ENCOUNTER — Other Ambulatory Visit: Payer: Self-pay

## 2022-04-14 MED ORDER — PHENTERMINE HCL 37.5 MG PO TABS
ORAL_TABLET | ORAL | 0 refills | Status: DC
  Filled 2022-04-14: qty 30, 30d supply, fill #0

## 2022-04-16 ENCOUNTER — Other Ambulatory Visit: Payer: Self-pay

## 2022-05-01 ENCOUNTER — Other Ambulatory Visit: Payer: Self-pay

## 2022-05-05 ENCOUNTER — Other Ambulatory Visit: Payer: Self-pay

## 2022-05-06 ENCOUNTER — Other Ambulatory Visit: Payer: Self-pay

## 2022-05-06 MED ORDER — CONTRAVE 8-90 MG PO TB12
ORAL_TABLET | ORAL | 0 refills | Status: DC
  Filled 2022-05-06 – 2022-05-13 (×3): qty 70, 28d supply, fill #0

## 2022-05-08 ENCOUNTER — Other Ambulatory Visit: Payer: Self-pay

## 2022-05-11 ENCOUNTER — Other Ambulatory Visit: Payer: Self-pay

## 2022-05-13 ENCOUNTER — Other Ambulatory Visit: Payer: Self-pay

## 2022-05-28 ENCOUNTER — Other Ambulatory Visit: Payer: Self-pay

## 2022-05-28 MED ORDER — CONTRAVE 8-90 MG PO TB12
ORAL_TABLET | ORAL | 3 refills | Status: DC
  Filled 2022-05-28 – 2022-06-04 (×2): qty 120, 30d supply, fill #0
  Filled 2022-07-06 – 2022-07-10 (×2): qty 120, 30d supply, fill #1

## 2022-06-03 ENCOUNTER — Other Ambulatory Visit: Payer: Self-pay

## 2022-06-04 ENCOUNTER — Other Ambulatory Visit: Payer: Self-pay

## 2022-06-05 ENCOUNTER — Other Ambulatory Visit: Payer: Self-pay

## 2022-06-08 ENCOUNTER — Other Ambulatory Visit: Payer: Self-pay | Admitting: Family Medicine

## 2022-06-08 ENCOUNTER — Other Ambulatory Visit: Payer: Self-pay

## 2022-06-08 DIAGNOSIS — M542 Cervicalgia: Secondary | ICD-10-CM

## 2022-06-08 MED ORDER — PREDNISONE 10 MG PO TABS
ORAL_TABLET | ORAL | 0 refills | Status: DC
  Filled 2022-06-08: qty 21, 6d supply, fill #0

## 2022-06-08 MED ORDER — METHOCARBAMOL 500 MG PO TABS
ORAL_TABLET | ORAL | 5 refills | Status: DC
  Filled 2022-06-08: qty 30, 30d supply, fill #0

## 2022-06-08 MED ORDER — CELECOXIB 100 MG PO CAPS
ORAL_CAPSULE | ORAL | 0 refills | Status: DC
  Filled 2022-06-08: qty 60, 30d supply, fill #0

## 2022-06-09 ENCOUNTER — Ambulatory Visit
Admission: RE | Admit: 2022-06-09 | Discharge: 2022-06-09 | Disposition: A | Discharge status: Home / Self Care | Payer: No Typology Code available for payment source | Source: Ambulatory Visit | Attending: Family Medicine | Admitting: Family Medicine

## 2022-06-09 ENCOUNTER — Other Ambulatory Visit: Payer: Self-pay | Admitting: Family Medicine

## 2022-06-09 DIAGNOSIS — M542 Cervicalgia: Secondary | ICD-10-CM | POA: Diagnosis present

## 2022-06-09 DIAGNOSIS — G9389 Other specified disorders of brain: Secondary | ICD-10-CM

## 2022-06-09 MED ORDER — GADOBUTROL 1 MMOL/ML IV SOLN
7.5000 mL | Freq: Once | INTRAVENOUS | Status: AC | PRN
  Administered 2022-06-09: 7.5 mL via INTRAVENOUS

## 2022-06-19 ENCOUNTER — Encounter: Payer: Self-pay | Admitting: Neurosurgery

## 2022-06-19 ENCOUNTER — Ambulatory Visit: Payer: No Typology Code available for payment source | Admitting: Neurosurgery

## 2022-06-19 VITALS — BP 126/82 | Ht 63.0 in | Wt 166.0 lb

## 2022-06-19 DIAGNOSIS — G9389 Other specified disorders of brain: Secondary | ICD-10-CM | POA: Diagnosis not present

## 2022-06-19 DIAGNOSIS — M5010 Cervical disc disorder with radiculopathy, unspecified cervical region: Secondary | ICD-10-CM

## 2022-06-19 DIAGNOSIS — M50123 Cervical disc disorder at C6-C7 level with radiculopathy: Secondary | ICD-10-CM | POA: Diagnosis not present

## 2022-06-19 NOTE — Addendum Note (Signed)
Addended by: Majel Homer on: 06/19/2022 12:40 PM   Modules accepted: Orders

## 2022-06-19 NOTE — Progress Notes (Signed)
Referring Physician:  Maryland Pink, MD [address] [place],  [place] [address]  Primary Physician:  Maryland Pink, MD  History of Present Illness: 06/19/2022 Lindsay Parsons is here today with a chief complaint of chronic neck pain and intermittent right radiating arm pain.  She says she has been dealing with this for about 2 years and has had improvement with steroids and conservative management in the past.  She has had recently had an exacerbation which unfortunately has not improved with her normal needs.  She now describes neck pain and pain that radiates down the length of her right arm and into her thumb and pointer finger.  She has associated numbness particularly in her hand.  She notes heaviness in her arm particularly with lifting it to do things like texting or provide patient care.  She endorses significant numbness particularly when waking up in the morning.  She has had a epidural steroid injection in the past however this made her symptoms worse and she is leery to repeat this. 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  She denies any changes in vision or headaches  Conservative measures:  Physical therapy: None with the last year for her neck Multimodal medical therapy including regular antiinflammatories: Celebrex, Neurontin, Robaxin, and prednisone Injections: No recent epidural steroid injections  Past Surgery: No previous cervical surgeries  Lindsay Parsons has no symptoms of cervical myelopathy.  The symptoms are causing a significant impact on the patient's life.   Review of Systems:  A 10 point review of systems is negative, except for the pertinent positives and negatives detailed in the HPI.  Past Medical History: Past Medical History:  Diagnosis Date   Bulging of cervical intervertebral disc    Kidney stones    Migraines    Renal  calculi    SVT (supraventricular tachycardia) (HCC)     Past Surgical History: Past Surgical History:  Procedure Laterality Date   EXTRACORPOREAL SHOCK WAVE LITHOTRIPSY Left 08/25/2018   Procedure: EXTRACORPOREAL SHOCK WAVE LITHOTRIPSY (ESWL);  Surgeon: Abbie Sons, MD;  Location: [facility];  Service: Urology;  Laterality: Left;   FOOT SURGERY  2007   cyst removed from left foot   GASTROC RECESSION EXTREMITY Left 03/26/2022   Procedure: GASTROC RECESSION AND PLANTAR FASCIA RELEASE;  Surgeon: Wylene Simmer, MD;  Location: [facility];  Service: Orthopedics;  Laterality: Left;   TONSILLECTOMY  1989    Allergies: Allergies as of 06/19/2022 - Review Complete 04/06/2022  Allergen Reaction Noted   Cefuroxime axetil Dermatitis 04/28/2012   Cephalosporins Hives and Itching 02/15/2016    Medications: Outpatient Encounter Medications as of 06/19/2022  Medication Sig   celecoxib (CELEBREX) 100 MG capsule Take 1 capsule (100 mg total) by mouth 2 (two) times daily for 30 days   cyanocobalamin (VITAMIN B12) 1000 MCG/ML injection Inject 1 mL (1,000 mcg total) into the muscle monthly   gabapentin (NEURONTIN) 100 MG capsule 2 po qHS   lidocaine (LIDODERM) 5 % 1 patch 12 hours on, 12 hours off   methocarbamol (ROBAXIN) 500 MG tablet 1/2-1 po qHS prn   metoCLOPramide (REGLAN) 10 MG tablet Take 1 tablet (10 mg total) by mouth 4 (four) times daily for 10 days   Naltrexone-buPROPion HCl ER (CONTRAVE) 8-90 MG TB12 Take 2 tablets by mouth 2 (two) times daily   ondansetron (ZOFRAN-ODT) 8 MG disintegrating tablet Take  1 tablet (8 mg total) by mouth every 8 (eight) hours as needed for Nausea for up to 7 days   Phendimetrazine Tartrate 35 MG TABS Take 1 tablet (35 mg total) by mouth 3 (three) times daily 1 hour before meals.   phentermine (ADIPEX-P) 37.5 MG tablet Take 1 tablet by mouth in the morning before breakfast   predniSONE (DELTASONE) 10 MG tablet Days 1-4 take 4 tablets (40 mg) daily   Days 5-8 take 3 tablets (30 mg) daily, Days 9-11 take 2 tablets (20 mg) daily, Days 12-14 take 1 tablet (10 mg) daily.   predniSONE (DELTASONE) 10 MG tablet 6 day taper - Take as directed   propranolol (INDERAL) 10 MG tablet Take 1 tablet (10 mg total) by mouth 2 (two) times daily With 20 mg tablet for total of 30 mg twice daily (Patient taking differently: 30 mg 2 (two) times daily.)   propranolol (INDERAL) 20 MG tablet TAKE 1 TABLET (20 MG TOTAL) BY MOUTH TWO TIMES DAILY (Patient taking differently: Take 30 mg by mouth 2 (two) times daily.)   propranolol (INDERAL) 20 MG tablet Take 1.5 tablets (30 mg total) by mouth 2 (two) times daily for 90 days   rizatriptan (MAXALT-MLT) 10 MG disintegrating tablet Take 1 tablet (10 mg total) by mouth once as needed for Migraine for up to 1 dose   tiZANidine (ZANAFLEX) 4 MG tablet 1/2-1 po HS prn   No facility-administered encounter medications on file as of 06/19/2022.    Social History: Social History   Tobacco Use   Smoking status: Never   Smokeless tobacco: Never  Vaping Use   Vaping Use: Never used  Substance Use Topics   Alcohol use: Yes    Comment: 3 or 4 beers a month   Drug use: No    Family Medical History: Family History  Problem Relation Age of Onset   Cancer Father    Hyperlipidemia Father    Cancer Maternal Grandmother    Cancer Maternal Grandfather    Cancer Paternal Grandfather    Hypertension Paternal Grandfather     Physical Examination:  Today's Vitals   06/19/22 1135  BP: 126/82  Weight: 75.3 kg  Height: '5\' 3"'$  (1.6 m)  PainSc: 4   PainLoc: Back   Body mass index is 29.41 kg/m.    General: Patient is well developed, well nourished, calm, collected, and in no apparent distress. Attention to examination is appropriate.  Psychiatric: Patient is non-anxious.  Head:  Pupils equal, round, and reactive to light.  ENT:  Oral mucosa appears well hydrated.  Neck:   Supple.    Respiratory: Patient is breathing  without any difficulty.  Extremities: No edema.  Vascular: Palpable dorsal pedal pulses.  Skin:   On exposed skin, there are no abnormal skin lesions.  NEUROLOGICAL:     Awake, alert, oriented to person, place, and time.  Speech is clear and fluent. Fund of knowledge is appropriate.   Cranial Nerves: Pupils equal round and reactive to light.  Facial tone is symmetric.  Facial sensation is symmetric.  ROM of spine: limited rotation to the right in cervical spine  Palpation of spine: non tender.    Strength: Side Biceps Triceps Deltoid Interossei Grip Wrist Ext. Wrist Flex.  R 4+ 4+ '5 5 5 5 5  '$ L '5 5 5 5 5 5 5   '$ Side Iliopsoas Quads Hamstring PF DF EHL  R '5 5 5 5 5 5  '$ L '5 5 5 5 '$ 5  5   Reflexes are 2+ and symmetric at the biceps, triceps, brachioradialis, patella and achilles.   Hoffman's is absent.  Clonus is not present.  Toes are down-going.  Bilateral upper and lower extremity sensation is intact to light touch.    Gait is normal.    Medical Decision Making  Imaging:  06/09/22 MRI brain IMPRESSION: 1. 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 2. 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     Electronically Signed   By: Logan Bores M.D.   On: 06/09/2022 14:04 06/09/22 MRI C spine IMPRESSION: C6-7: Worsening/enlargement of a broad-based disc herniation more prominent towards the right. Narrowing of the ventral subarachnoid space with slight indentation of the cord to the right of midline. Proximal foraminal narrowing right more than left could affect the right C7 nerve.   C5-6: Chronic endplate osteophytes and shallow disc protrusion with slight caudal  down turning. Narrowing of the ventral subarachnoid space but no compression of the cord. No likely compressive foraminal narrowing. No change since the prior study.   T2 bright lesion of the clivus measuring up to 8 mm, 2 or 3 mm larger than on the study of 2021. I would recommend specific evaluation of this using MRI of the brain with and without contrast utilizing sensorineural hearing loss protocol. This may represent a benign developmental nodal chordal finding, but early/incidental chordoma could have this appearance. This may require further follow-up.     Electronically Signed   By: Nelson Chimes M.D.   On: 06/09/2022 09:24    I have personally reviewed the images and agree with the above interpretation.  Assessment and Plan: Ms. Bittman is a pleasant 36 y.o. 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  We discussed treatment options which include conservative management with physical therapy and injections however she is hesitant to try injections as these have worsened her symptoms in the past.  We will move forward with physical therapy for 6 weeks or until she is discharged should physical therapy feel that she is not benefiting from this.  We discussed the possibility of surgical intervention however she would like to hold off on considering this at this time.  We will see her back via telephone visit in 8 weeks to evaluate her progress with therapy and discuss further treatment plan.  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 She would like to see someone within the [facility] system regarding this if possible due to her insurance. I will discuss options with Dr. Izora Ribas as I do not know any skull based surgeons at [facility]. If there is not one we will refer her to someone at [facility].   She was encouraged to call the  office in the interim should she have any questions or concerns.  She expressed understanding and was in agreement with this plan.    Thank you for involving me in the care of this patient.   I spent a total of 45 minutes in both face-to-face and non-face-to-face activities for this visit on the date of this encounter including review of outside records,  review of imaging, review of symptoms, physical exam, discussion of differential diagnosis and treatment plans, documentation, and order placement.   [facility]

## 2022-06-29 ENCOUNTER — Other Ambulatory Visit: Payer: Self-pay

## 2022-06-29 MED ORDER — PROPRANOLOL HCL 20 MG PO TABS
ORAL_TABLET | ORAL | 3 refills | Status: DC
  Filled 2022-06-29: qty 270, 90d supply, fill #0
  Filled 2022-09-07: qty 70, 24d supply, fill #0
  Filled 2022-09-07: qty 200, 66d supply, fill #0
  Filled 2022-11-30 – 2022-12-09 (×3): qty 270, 90d supply, fill #1
  Filled 2023-02-22: qty 270, 90d supply, fill #2
  Filled 2023-06-04: qty 270, 90d supply, fill #3

## 2022-06-29 MED ORDER — RIZATRIPTAN BENZOATE 10 MG PO TBDP
ORAL_TABLET | ORAL | 11 refills | Status: DC
  Filled 2022-06-29: qty 6, 15d supply, fill #0
  Filled 2022-10-19: qty 6, 15d supply, fill #1
  Filled 2022-11-30 – 2022-12-09 (×3): qty 6, 15d supply, fill #2
  Filled 2023-01-21: qty 6, 15d supply, fill #3
  Filled 2023-02-22: qty 6, 15d supply, fill #4
  Filled 2023-05-25: qty 6, 15d supply, fill #5

## 2022-06-29 MED ORDER — ONDANSETRON 8 MG PO TBDP
ORAL_TABLET | ORAL | 11 refills | Status: AC
  Filled 2022-06-29: qty 20, 6d supply, fill #0
  Filled 2022-09-07: qty 20, 6d supply, fill #1
  Filled 2022-10-19: qty 20, 6d supply, fill #2
  Filled 2022-11-30 – 2022-12-09 (×3): qty 20, 6d supply, fill #3
  Filled 2023-01-21: qty 20, 6d supply, fill #4
  Filled 2023-03-29: qty 20, 6d supply, fill #5
  Filled 2023-05-25: qty 20, 6d supply, fill #6

## 2022-07-06 ENCOUNTER — Other Ambulatory Visit: Payer: Self-pay

## 2022-07-07 ENCOUNTER — Other Ambulatory Visit: Payer: Self-pay

## 2022-07-07 ENCOUNTER — Other Ambulatory Visit: Payer: Self-pay | Admitting: Neurosurgery

## 2022-07-07 MED ORDER — METHYLPREDNISOLONE 4 MG PO TBPK
ORAL_TABLET | ORAL | 0 refills | Status: DC
  Filled 2022-07-07: qty 21, 6d supply, fill #0

## 2022-07-09 ENCOUNTER — Other Ambulatory Visit: Payer: Self-pay

## 2022-07-10 ENCOUNTER — Other Ambulatory Visit (HOSPITAL_COMMUNITY): Payer: Self-pay

## 2022-07-10 ENCOUNTER — Other Ambulatory Visit: Payer: Self-pay

## 2022-07-22 ENCOUNTER — Other Ambulatory Visit: Payer: Self-pay

## 2022-07-22 MED ORDER — AMOXICILLIN 875 MG PO TABS
ORAL_TABLET | ORAL | 0 refills | Status: DC
  Filled 2022-07-22: qty 14, 7d supply, fill #0

## 2022-07-22 MED ORDER — PREDNISONE 10 MG PO TABS
ORAL_TABLET | ORAL | 0 refills | Status: DC
  Filled 2022-07-22: qty 7, 7d supply, fill #0

## 2022-07-28 ENCOUNTER — Other Ambulatory Visit: Payer: Self-pay

## 2022-07-28 MED ORDER — PHENTERMINE HCL 37.5 MG PO TABS
ORAL_TABLET | ORAL | 0 refills | Status: DC
  Filled 2022-07-28: qty 30, 30d supply, fill #0

## 2022-08-07 ENCOUNTER — Telehealth: Payer: No Typology Code available for payment source | Admitting: Orthopedic Surgery

## 2022-08-10 NOTE — H&P (View-Only) (Signed)
Referring Physician:  Loleta Dicker, [place],  [place] [address]  Primary Physician:  Maryland Pink, MD  History of Present Illness: 08/11/2022 Lindsay Parsons is here today with a chief complaint of right arm pain primarily to her shoulder blade but also extending down her right arm into her forearm and middle 3 fingers.  She reports sharp electric pain worse when she turns her head to the right.  Certain other movements and positions make it worse as well.  Ice and Motrin sometimes help.  She has the worst pain currently in her shoulder blade, as her forearm pain is slightly better after therapy.  She has been having pain for nearly 2 years.  She did therapy in August and September with mild improvement, but her pain continues to be a significant issue for her.   Conservative measures:  Physical therapy: has participated at [facility] from 06/29/22 to 07/27/22 (not discharged) Multimodal medical therapy including regular antiinflammatories:  celebrex, gabapentin, robaxin, and prednisone Injections:  has received epidural steroid injections 03/06/2022: Right C5-6 transforaminal ESI (made symptoms worse)  Past Surgery: denies  Lindsay Parsons has no symptoms of cervical myelopathy.  The symptoms are causing a significant impact on the patient's life.   Review of Systems:  A 10 point review of systems is negative, except for the pertinent positives and negatives detailed in the HPI.  Past Medical History: Past Medical History:  Diagnosis Date   Bulging of cervical intervertebral disc    Kidney stones    Migraines    Renal calculi    SVT (supraventricular tachycardia)     Past Surgical History: Past Surgical History:  Procedure Laterality Date   EXTRACORPOREAL SHOCK WAVE LITHOTRIPSY Left 08/25/2018   Procedure: EXTRACORPOREAL SHOCK WAVE LITHOTRIPSY (ESWL);  Surgeon: Abbie Sons, MD;  Location: [facility];  Service:  Urology;  Laterality: Left;   FOOT SURGERY  2007   cyst removed from left foot   GASTROC RECESSION EXTREMITY Left 03/26/2022   Procedure: GASTROC RECESSION AND PLANTAR FASCIA RELEASE;  Surgeon: Wylene Simmer, MD;  Location: [facility];  Service: Orthopedics;  Laterality: Left;   TONSILLECTOMY  1989    Allergies: Allergies as of 08/11/2022 - Review Complete 06/19/2022  Allergen Reaction Noted   Cefuroxime axetil Dermatitis 04/28/2012   Cephalosporins Hives and Itching 02/15/2016    Medications: Current Meds  Medication Sig   cyanocobalamin (VITAMIN B12) 1000 MCG/ML injection Inject 1 mL (1,000 mcg total) into the muscle monthly   ondansetron (ZOFRAN-ODT) 8 MG disintegrating tablet Take 1 tablet (8 mg total) by mouth every 8 (eight) hours as needed for up to 360 days   propranolol (INDERAL) 20 MG tablet Take 1.5 tablets (30 mg total) by mouth 2 (two) times daily for 360 days   rizatriptan (MAXALT-MLT) 10 MG disintegrating tablet Take 1 tablet (10 mg total) by mouth as directed for up to 360 doses    Social History: Social History   Tobacco Use   Smoking status: Never   Smokeless tobacco: Never  Vaping Use   Vaping Use: Never used  Substance Use Topics   Alcohol use: Yes    Comment: 3 or 4 beers a month   Drug use: No    Family Medical History: Family History  Problem Relation Age of Onset   Cancer Father    Hyperlipidemia Father    Cancer Maternal Grandmother    Cancer Maternal Grandfather    Cancer  Paternal Grandfather    Hypertension Paternal Grandfather     Physical Examination: Vitals:   08/11/22 0906  BP: (!) 148/98  Pulse: 68    General: Patient is well developed, well nourished, calm, collected, and in no apparent distress. Attention to examination is appropriate.  Neck:   Supple.  Full range of motion with discomfort on rotation to the right.  Rotation to the right causes scapular pain  Respiratory: Patient is breathing without any  difficulty.   NEUROLOGICAL:     Awake, alert, oriented to person, place, and time.  Speech is clear and fluent. Fund of knowledge is appropriate.   Cranial Nerves: Pupils equal round and reactive to light.  Facial tone is symmetric.  Facial sensation is symmetric. Shoulder shrug is symmetric. Tongue protrusion is midline.  There is no pronator drift.  ROM of spine: full.    Strength: Side Biceps Triceps Deltoid Interossei Grip Wrist Ext. Wrist Flex.  R '5 5 5 5 5 5 5  '$ L '5 5 5 5 5 5 5   '$ Side Iliopsoas Quads Hamstring PF DF EHL  R '5 5 5 5 5 5  '$ L '5 5 5 5 5 5   '$ Reflexes are 1+ and symmetric at the biceps, triceps, brachioradialis, patella and achilles.   Hoffman's is absent.   Bilateral upper and lower extremity sensation is intact to light touch.    No evidence of dysmetria noted.  Gait is normal.     Medical Decision Making  Imaging: MRI C spine 06/09/22 IMPRESSION: C6-7: Worsening/enlargement of a broad-based disc herniation more prominent towards the right. Narrowing of the ventral subarachnoid space with slight indentation of the cord to the right of midline. Proximal foraminal narrowing right more than left could affect the right C7 nerve.   C5-6: Chronic endplate osteophytes and shallow disc protrusion with slight caudal down turning. Narrowing of the ventral subarachnoid space but no compression of the cord. No likely compressive foraminal narrowing. No change since the prior study.   T2 bright lesion of the clivus measuring up to 8 mm, 2 or 3 mm larger than on the study of 2021. I would recommend specific evaluation of this using MRI of the brain with and without contrast utilizing sensorineural hearing loss protocol. This may represent a benign developmental nodal chordal finding, but early/incidental chordoma could have this appearance. This may require further follow-up.     Electronically Signed   By: Nelson Chimes M.D.   On: 06/09/2022 09:24  I have  personally reviewed the images and agree with the above interpretation.  Assessment and Plan: Lindsay Parsons is a pleasant 36 y.o. 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  She has been having symptoms from approximately 2 years with only mild improvement with physical therapy.  This is impacting her day-to-day life, though she is unsure if she would like to move forward with surgical intervention at this time.  I will get baseline flexion-extension x-rays today to see if she might be a candidate for an artificial disc.  I do think she has maximized conservative management at this point.  She started physical therapy more than 6 weeks ago without symptoms stand show improvement, and has been having symptoms for nearly 2 years.  If she is a candidate for artificial disc, I would recommend C6/7 cervical disc arthroplasty.  I discussed the planned procedure at length with the patient, including the risks, benefits, alternatives, and indications. The risks discussed include but are not  limited to bleeding, infection, need for reoperation, spinal fluid leak, stroke, vision loss, anesthetic complication, coma, paralysis, and even death. We also discussed the possibility of post-operative dysphagia, vocal cord paralysis, and the risk of adjacent segment disease in the future. I also described in detail that improvement was not guaranteed.  The patient expressed understanding of these risks. I described the surgery in layman's terms, and gave ample opportunity for questions, which were answered to the best of my ability.  She would like to think about it, and will let me know.  I spent a total of 30 minutes in face-to-face and non-face-to-face activities related to this patient's care today.  Thank you for involving me in the care of this patient.      Jennica Tagliaferri K. Izora Ribas MD, [facility] Neurosurgery

## 2022-08-10 NOTE — Progress Notes (Signed)
Referring Physician:  Loleta Dicker, [address] [place],  [place] [address]  Primary Physician:  [place] Pink, MD  History of Present Illness: 08/11/2022 Ms. Niara Bunker is here today with a chief complaint of right arm pain primarily to her shoulder blade but also extending down her right arm into her forearm and middle 3 fingers.  She reports sharp electric pain worse when she turns her head to the right.  Certain other movements and positions make it worse as well.  Ice and Motrin sometimes help.  She has the worst pain currently in her shoulder blade, as her forearm pain is slightly better after therapy.  She has been having pain for nearly 2 years.  She did therapy in August and September with mild improvement, but her pain continues to be a significant issue for her.   Conservative measures:  Physical therapy: has participated at [facility] from 06/29/22 to 07/27/22 (not discharged) Multimodal medical therapy including regular antiinflammatories:  celebrex, gabapentin, robaxin, and prednisone Injections:  has received epidural steroid injections 03/06/2022: Right C5-6 transforaminal ESI (made symptoms worse)  Past Surgery: denies  [place] Zou has no symptoms of cervical myelopathy.  The symptoms are causing a significant impact on the patient's life.   Review of Systems:  A 10 point review of systems is negative, except for the pertinent positives and negatives detailed in the HPI.  Past Medical History: Past Medical History:  Diagnosis Date   Bulging of cervical intervertebral disc    Kidney stones    Migraines    Renal calculi    SVT (supraventricular tachycardia)     Past Surgical History: Past Surgical History:  Procedure Laterality Date   EXTRACORPOREAL SHOCK WAVE LITHOTRIPSY Left 08/25/2018   Procedure: EXTRACORPOREAL SHOCK WAVE LITHOTRIPSY (ESWL);  Surgeon: Abbie Sons, MD;  Location: [facility] ORS;  Service:  Urology;  Laterality: Left;   FOOT SURGERY  2007   cyst removed from left foot   GASTROC RECESSION EXTREMITY Left 03/26/2022   Procedure: GASTROC RECESSION AND PLANTAR FASCIA RELEASE;  Surgeon: Wylene Simmer, MD;  Location: [facility];  Service: Orthopedics;  Laterality: Left;   TONSILLECTOMY  1989    Allergies: Allergies as of 08/11/2022 - Review Complete 06/19/2022  Allergen Reaction Noted   Cefuroxime axetil Dermatitis 04/28/2012   Cephalosporins Hives and Itching 02/15/2016    Medications: Current Meds  Medication Sig   cyanocobalamin (VITAMIN B12) 1000 MCG/ML injection Inject 1 mL (1,000 mcg total) into the muscle monthly   ondansetron (ZOFRAN-ODT) 8 MG disintegrating tablet Take 1 tablet (8 mg total) by mouth every 8 (eight) hours as needed for up to 360 days   propranolol (INDERAL) 20 MG tablet Take 1.5 tablets (30 mg total) by mouth 2 (two) times daily for 360 days   rizatriptan (MAXALT-MLT) 10 MG disintegrating tablet Take 1 tablet (10 mg total) by mouth as directed for up to 360 doses    Social History: Social History   Tobacco Use   Smoking status: Never   Smokeless tobacco: Never  Vaping Use   Vaping Use: Never used  Substance Use Topics   Alcohol use: Yes    Comment: 3 or 4 beers a month   Drug use: No    Family Medical History: Family History  Problem Relation Age of Onset   Cancer Father    Hyperlipidemia Father    Cancer Maternal Grandmother    Cancer Maternal Grandfather    Cancer  Paternal Grandfather    Hypertension Paternal Grandfather     Physical Examination: Vitals:   08/11/22 0906  BP: (!) 148/98  Pulse: 68    General: Patient is well developed, well nourished, calm, collected, and in no apparent distress. Attention to examination is appropriate.  Neck:   Supple.  Full range of motion with discomfort on rotation to the right.  Rotation to the right causes scapular pain  Respiratory: Patient is breathing without any  difficulty.   NEUROLOGICAL:     Awake, alert, oriented to person, place, and time.  Speech is clear and fluent. Fund of knowledge is appropriate.   Cranial Nerves: Pupils equal round and reactive to light.  Facial tone is symmetric.  Facial sensation is symmetric. Shoulder shrug is symmetric. Tongue protrusion is midline.  There is no pronator drift.  ROM of spine: full.    Strength: Side Biceps Triceps Deltoid Interossei Grip Wrist Ext. Wrist Flex.  R '5 5 5 5 5 5 5  '$ L '5 5 5 5 5 5 5   '$ Side Iliopsoas Quads Hamstring PF DF EHL  R '5 5 5 5 5 5  '$ L '5 5 5 5 5 5   '$ Reflexes are 1+ and symmetric at the biceps, triceps, brachioradialis, patella and achilles.   Hoffman's is absent.   Bilateral upper and lower extremity sensation is intact to light touch.    No evidence of dysmetria noted.  Gait is normal.     Medical Decision Making  Imaging: MRI C spine 06/09/22 IMPRESSION: C6-7: Worsening/enlargement of a broad-based disc herniation more prominent towards the right. Narrowing of the ventral subarachnoid space with slight indentation of the cord to the right of midline. Proximal foraminal narrowing right more than left could affect the right C7 nerve.   C5-6: Chronic endplate osteophytes and shallow disc protrusion with slight caudal down turning. Narrowing of the ventral subarachnoid space but no compression of the cord. No likely compressive foraminal narrowing. No change since the prior study.   T2 bright lesion of the clivus measuring up to 8 mm, 2 or 3 mm larger than on the study of 2021. I would recommend specific evaluation of this using MRI of the brain with and without contrast utilizing sensorineural hearing loss protocol. This may represent a benign developmental nodal chordal finding, but early/incidental chordoma could have this appearance. This may require further follow-up.     Electronically Signed   By: Nelson Chimes M.D.   On: 06/09/2022 09:24  I have  personally reviewed the images and agree with the above interpretation.  Assessment and Plan: Ms. Beckworth is a pleasant 36 y.o. 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  She has been having symptoms from approximately 2 years with only mild improvement with physical therapy.  This is impacting her day-to-day life, though she is unsure if she would like to move forward with surgical intervention at this time.  I will get baseline flexion-extension x-rays today to see if she might be a candidate for an artificial disc.  I do think she has maximized conservative management at this point.  She started physical therapy more than 6 weeks ago without symptoms stand show improvement, and has been having symptoms for nearly 2 years.  If she is a candidate for artificial disc, I would recommend C6/7 cervical disc arthroplasty.  I discussed the planned procedure at length with the patient, including the risks, benefits, alternatives, and indications. The risks discussed include but are not  limited to bleeding, infection, need for reoperation, spinal fluid leak, stroke, vision loss, anesthetic complication, coma, paralysis, and even death. We also discussed the possibility of post-operative dysphagia, vocal cord paralysis, and the risk of adjacent segment disease in the future. I also described in detail that improvement was not guaranteed.  The patient expressed understanding of these risks. I described the surgery in layman's terms, and gave ample opportunity for questions, which were answered to the best of my ability.  She would like to think about it, and will let me know.  I spent a total of 30 minutes in face-to-face and non-face-to-face activities related to this patient's care today.  Thank you for involving me in the care of this patient.      Riel Hirschman K. Izora Ribas MD, [facility]

## 2022-08-11 ENCOUNTER — Ambulatory Visit
Admission: RE | Admit: 2022-08-11 | Discharge: 2022-08-11 | Disposition: A | Discharge status: Home / Self Care | Payer: No Typology Code available for payment source | Source: Ambulatory Visit | Attending: Neurosurgery | Admitting: Neurosurgery

## 2022-08-11 ENCOUNTER — Ambulatory Visit
Admission: RE | Admit: 2022-08-11 | Discharge: 2022-08-11 | Disposition: A | Discharge status: Home / Self Care | Payer: No Typology Code available for payment source | Attending: Neurosurgery | Admitting: Neurosurgery

## 2022-08-11 ENCOUNTER — Ambulatory Visit (INDEPENDENT_AMBULATORY_CARE_PROVIDER_SITE_OTHER): Payer: No Typology Code available for payment source | Admitting: Neurosurgery

## 2022-08-11 ENCOUNTER — Encounter: Payer: Self-pay | Admitting: Neurosurgery

## 2022-08-11 VITALS — BP 148/98 | HR 68 | Ht 63.0 in | Wt 180.6 lb

## 2022-08-11 DIAGNOSIS — M5412 Radiculopathy, cervical region: Secondary | ICD-10-CM | POA: Diagnosis present

## 2022-08-11 DIAGNOSIS — M50123 Cervical disc disorder at C6-C7 level with radiculopathy: Secondary | ICD-10-CM | POA: Diagnosis not present

## 2022-08-11 DIAGNOSIS — M4722 Other spondylosis with radiculopathy, cervical region: Secondary | ICD-10-CM | POA: Insufficient documentation

## 2022-08-14 ENCOUNTER — Telehealth: Payer: Self-pay

## 2022-08-14 NOTE — Telephone Encounter (Signed)
I spoke with Mrs Labra. We will plan for surgery on 09/09/22.

## 2022-08-17 ENCOUNTER — Other Ambulatory Visit: Payer: Self-pay

## 2022-08-17 DIAGNOSIS — Z01818 Encounter for other preprocedural examination: Secondary | ICD-10-CM

## 2022-08-31 ENCOUNTER — Encounter
Admission: RE | Admit: 2022-08-31 | Discharge: 2022-08-31 | Disposition: A | Discharge status: Home / Self Care | Payer: No Typology Code available for payment source | Source: Ambulatory Visit | Attending: Neurosurgery | Admitting: Neurosurgery

## 2022-08-31 ENCOUNTER — Other Ambulatory Visit: Payer: Self-pay

## 2022-08-31 ENCOUNTER — Inpatient Hospital Stay: Admission: RE | Admit: 2022-08-31 | Payer: No Typology Code available for payment source | Source: Ambulatory Visit

## 2022-08-31 ENCOUNTER — Encounter: Payer: Self-pay | Admitting: Urgent Care

## 2022-08-31 DIAGNOSIS — Z01812 Encounter for preprocedural laboratory examination: Secondary | ICD-10-CM | POA: Insufficient documentation

## 2022-08-31 DIAGNOSIS — Z01818 Encounter for other preprocedural examination: Secondary | ICD-10-CM

## 2022-08-31 DIAGNOSIS — M5090 Cervical disc disorder, unspecified, unspecified cervical region: Secondary | ICD-10-CM | POA: Diagnosis not present

## 2022-08-31 HISTORY — DX: Benign neoplasm of liver: D13.4

## 2022-08-31 HISTORY — DX: Personal history of urinary calculi: Z87.442

## 2022-08-31 LAB — CBC
HCT: 39.4 % (ref 36.0–46.0)
Hemoglobin: 13.7 g/dL (ref 12.0–15.0)
MCH: 31.3 pg (ref 26.0–34.0)
MCHC: 34.8 g/dL (ref 30.0–36.0)
MCV: 90 fL (ref 80.0–100.0)
Platelets: 296 10*3/uL (ref 150–400)
RBC: 4.38 MIL/uL (ref 3.87–5.11)
RDW: 11.9 % (ref 11.5–15.5)
WBC: 8.1 10*3/uL (ref 4.0–10.5)
nRBC: 0 % (ref 0.0–0.2)

## 2022-08-31 LAB — BASIC METABOLIC PANEL
Anion gap: 6 (ref 5–15)
BUN: 14 mg/dL (ref 6–20)
CO2: 23 mmol/L (ref 22–32)
Calcium: 9.1 mg/dL (ref 8.9–10.3)
Chloride: 109 mmol/L (ref 98–111)
Creatinine, Ser: 0.75 mg/dL (ref 0.44–1.00)
GFR, Estimated: >60 mL/min (ref >60)
Glucose, Bld: 88 mg/dL (ref 70–99)
Potassium: 3.9 mmol/L (ref 3.5–5.1)
Sodium: 138 mmol/L (ref 135–145)

## 2022-08-31 LAB — SURGICAL PCR SCREEN
MRSA, PCR: NEGATIVE
Staphylococcus aureus: NEGATIVE

## 2022-08-31 LAB — TYPE AND SCREEN
ABO/RH(D): A POS
Antibody Screen: NEGATIVE

## 2022-08-31 NOTE — Patient Instructions (Addendum)
Your procedure is scheduled on: 09/09/2022  Report to the Registration Desk on the 1st floor of the [facility]. To find out your arrival time, please call [phone] between 1PM - 3PM on: October 31/2023  If your arrival time is 6:00 am, do not arrive prior to that time as the [facility] entrance doors do not open until 6:00 am.  REMEMBER: Instructions that are not followed completely may result in serious medical risk, up to and including death; or upon the discretion of your surgeon and anesthesiologist your surgery may need to be rescheduled.  Do not eat food after midnight the night before surgery.  No gum chewing, lozengers or hard candies.  You may however, drink CLEAR liquids up to 2 hours before you are scheduled to arrive for your surgery. Do not drink anything within 2 hours of your scheduled arrival time.  Clear liquids include: - water  - apple juice without pulp - gatorade (not RED colors) - black coffee or tea (Do NOT add milk or creamers to the coffee or tea) Do NOT drink anything that is not on this list.   TAKE THESE MEDICATIONS THE MORNING OF SURGERY WITH A SIP OF WATER: Take  propranolol as prescribed for headache/migraine Rizatriptan as prescribed ondansetron as prescribed    One week prior to surgery: Stop Anti-inflammatories (NSAIDS) such as Advil, Aleve, Ibuprofen, Motrin, Naproxen, Naprosyn and Aspirin based products such as Excedrin, Goodys Powder, BC Powder. Stop ANY OVER THE COUNTER oral supplements until after surgery.  You may however, continue to take Tylenol if needed for pain up until the day of surgery.  No Alcohol for 24 hours before or after surgery.  No Smoking including e-cigarettes for 24 hours prior to surgery.  No chewable tobacco products for at least 6 hours prior to surgery.  No nicotine patches on the day of surgery.  Do not use any "recreational" drugs for at least a week prior to your surgery.  Please be advised that the  combination of cocaine and anesthesia may have negative outcomes, up to and including death. If you test positive for cocaine, your surgery will be cancelled.  On the morning of surgery brush your teeth with toothpaste and water, you may rinse your mouth with mouthwash if you wish. Do not swallow any toothpaste or mouthwash.  Use CHG Soap  as directed on instruction sheet  Do not wear jewelry, make-up, hairpins, clips or nail polish.  Do not wear lotions, powders, or perfumes.   Do not shave body from the neck down 48 hours prior to surgery just in case you cut yourself which could leave a site for infection.  Also, freshly shaved skin may become irritated if using the CHG soap.  Contact lenses, hearing aids and dentures may not be worn into surgery.  Do not bring valuables to the hospital. [facility] is not responsible for any missing/lost belongings or valuables.    Notify your doctor if there is any change in your medical condition (cold, fever, infection).  Wear comfortable clothing (specific to your surgery type) to the hospital.  After surgery, you can help prevent lung complications by doing breathing exercises.  Take deep breaths and cough every 1-2 hours. Your doctor may order a device called an Incentive Spirometer to help you take deep breaths.   If you are being admitted to the hospital overnight, leave your suitcase in the car. After surgery it may be brought to your room.  If you are being  discharged the day of surgery, you will not be allowed to drive home. You will need a responsible adult (18 years or older) to drive you home and stay with you that night.   If you are taking public transportation, you will need to have a responsible adult (18 years or older) with you. Please confirm with your physician that it is acceptable to use public transportation.   Please call the [facility] at [phone] if you have any questions about these  instructions.  Surgery Visitation Policy:  Patients undergoing a surgery or procedure may have two family members or support persons with them as long as the person is not COVID-19 positive or experiencing its symptoms.   Inpatient Visitation:    Visiting hours are 7 a.m. to 8 p.m. Up to four visitors are allowed at one time in a patient room, including children. The visitors may rotate out with other people during the day. One designated support person (adult) may remain overnight. Your procedure is scheduled on: Report to the Registration Desk on the 1st floor of the [facility]. To find out your arrival time, please call [phone] between 1PM - 3PM on: If your arrival time is 6:00 am, do not arrive prior to that time as the [facility] entrance doors do not open until 6:00 am.

## 2022-09-07 ENCOUNTER — Other Ambulatory Visit: Payer: Self-pay

## 2022-09-09 ENCOUNTER — Other Ambulatory Visit: Payer: Self-pay

## 2022-09-09 ENCOUNTER — Encounter: Payer: Self-pay | Admitting: Neurosurgery

## 2022-09-09 ENCOUNTER — Ambulatory Visit: Payer: No Typology Code available for payment source | Admitting: Anesthesiology

## 2022-09-09 ENCOUNTER — Encounter
Admission: RE | Disposition: A | Discharge status: Home / Self Care | Payer: Self-pay | Source: Home / Self Care | Attending: Neurosurgery

## 2022-09-09 ENCOUNTER — Ambulatory Visit: Payer: No Typology Code available for payment source

## 2022-09-09 ENCOUNTER — Ambulatory Visit
Admission: RE | Admit: 2022-09-09 | Discharge: 2022-09-09 | Disposition: A | Discharge status: Home / Self Care | Payer: No Typology Code available for payment source | Attending: Neurosurgery | Admitting: Neurosurgery

## 2022-09-09 DIAGNOSIS — Z6831 Body mass index (BMI) 31.0-31.9, adult: Secondary | ICD-10-CM | POA: Insufficient documentation

## 2022-09-09 DIAGNOSIS — M5412 Radiculopathy, cervical region: Secondary | ICD-10-CM

## 2022-09-09 DIAGNOSIS — M50123 Cervical disc disorder at C6-C7 level with radiculopathy: Secondary | ICD-10-CM | POA: Diagnosis present

## 2022-09-09 DIAGNOSIS — I10 Essential (primary) hypertension: Secondary | ICD-10-CM | POA: Diagnosis not present

## 2022-09-09 DIAGNOSIS — G43909 Migraine, unspecified, not intractable, without status migrainosus: Secondary | ICD-10-CM | POA: Insufficient documentation

## 2022-09-09 DIAGNOSIS — Z01818 Encounter for other preprocedural examination: Secondary | ICD-10-CM

## 2022-09-09 DIAGNOSIS — E669 Obesity, unspecified: Secondary | ICD-10-CM | POA: Insufficient documentation

## 2022-09-09 HISTORY — PX: CERVICAL DISC ARTHROPLASTY: SHX587

## 2022-09-09 LAB — TYPE AND SCREEN
ABO/RH(D): A POS
Antibody Screen: NEGATIVE

## 2022-09-09 LAB — POCT PREGNANCY, URINE: Preg Test, Ur: NEGATIVE

## 2022-09-09 SURGERY — CERVICAL ANTERIOR DISC ARTHROPLASTY
Anesthesia: General | Site: Neck

## 2022-09-09 MED ORDER — LACTATED RINGERS IV SOLN: INTRAVENOUS | Status: DC

## 2022-09-09 MED ORDER — IBUPROFEN 800 MG PO TABS: 800.0000 mg | ORAL_TABLET | Freq: Three times a day (TID) | ORAL | 0 refills | Status: DC

## 2022-09-09 MED ORDER — PROPOFOL 10 MG/ML IV BOLUS
INTRAVENOUS | Status: AC
  Filled 2022-09-09: qty 20

## 2022-09-09 MED ORDER — KETOROLAC TROMETHAMINE 15 MG/ML IJ SOLN
15.0000 mg | Freq: Once | INTRAMUSCULAR | Status: AC
  Administered 2022-09-09: 15 mg via INTRAVENOUS

## 2022-09-09 MED ORDER — CHLORHEXIDINE GLUCONATE 0.12 % MT SOLN: 15.0000 mL | Freq: Once | OROMUCOSAL | Status: AC

## 2022-09-09 MED ORDER — DIPHENHYDRAMINE HCL 50 MG/ML IJ SOLN
25.0000 mg | Freq: Once | INTRAMUSCULAR | Status: AC
  Administered 2022-09-09: 25 mg via INTRAVENOUS

## 2022-09-09 MED ORDER — CHLORHEXIDINE GLUCONATE 0.12 % MT SOLN
OROMUCOSAL | Status: AC
  Administered 2022-09-09: 15 mL via OROMUCOSAL
  Filled 2022-09-09: qty 15

## 2022-09-09 MED ORDER — DEXAMETHASONE SODIUM PHOSPHATE 10 MG/ML IJ SOLN
INTRAMUSCULAR | Status: AC
  Filled 2022-09-09: qty 1

## 2022-09-09 MED ORDER — PROPOFOL 1000 MG/100ML IV EMUL
INTRAVENOUS | Status: AC
  Filled 2022-09-09: qty 100

## 2022-09-09 MED ORDER — ONDANSETRON HCL 4 MG/2ML IJ SOLN
INTRAMUSCULAR | Status: AC
  Filled 2022-09-09: qty 2

## 2022-09-09 MED ORDER — DEXMEDETOMIDINE HCL IN NACL 80 MCG/20ML IV SOLN
INTRAVENOUS | Status: AC
  Filled 2022-09-09: qty 20

## 2022-09-09 MED ORDER — PROPOFOL 500 MG/50ML IV EMUL
INTRAVENOUS | Status: DC | PRN
  Administered 2022-09-09: 100 ug/kg/min via INTRAVENOUS

## 2022-09-09 MED ORDER — FENTANYL CITRATE (PF) 100 MCG/2ML IJ SOLN
INTRAMUSCULAR | Status: AC
  Filled 2022-09-09: qty 2

## 2022-09-09 MED ORDER — ROCURONIUM BROMIDE 100 MG/10ML IV SOLN
INTRAVENOUS | Status: DC | PRN
  Administered 2022-09-09: 5 mg via INTRAVENOUS

## 2022-09-09 MED ORDER — ACETAMINOPHEN 10 MG/ML IV SOLN
INTRAVENOUS | Status: DC | PRN
  Administered 2022-09-09: 1000 mg via INTRAVENOUS

## 2022-09-09 MED ORDER — VANCOMYCIN HCL IN DEXTROSE 1-5 GM/200ML-% IV SOLN: 1000.0000 mg | Freq: Once | INTRAVENOUS | Status: AC

## 2022-09-09 MED ORDER — MIDAZOLAM HCL 2 MG/2ML IJ SOLN
INTRAMUSCULAR | Status: AC
  Filled 2022-09-09: qty 2

## 2022-09-09 MED ORDER — ONDANSETRON HCL 4 MG/2ML IJ SOLN: 4.0000 mg | Freq: Once | INTRAMUSCULAR | Status: DC | PRN

## 2022-09-09 MED ORDER — DIPHENHYDRAMINE HCL 50 MG/ML IJ SOLN
INTRAMUSCULAR | Status: AC
  Filled 2022-09-09: qty 1

## 2022-09-09 MED ORDER — BUPIVACAINE-EPINEPHRINE (PF) 0.5% -1:200000 IJ SOLN
INTRAMUSCULAR | Status: DC | PRN
  Administered 2022-09-09: 3 mL

## 2022-09-09 MED ORDER — OXYCODONE HCL 5 MG PO TABS: 5.0000 mg | ORAL_TABLET | Freq: Once | ORAL | Status: DC | PRN

## 2022-09-09 MED ORDER — LIDOCAINE HCL (CARDIAC) PF 100 MG/5ML IV SOSY
PREFILLED_SYRINGE | INTRAVENOUS | Status: DC | PRN
  Administered 2022-09-09: 80 mg via INTRAVENOUS

## 2022-09-09 MED ORDER — METHOCARBAMOL 500 MG PO TABS
500.0000 mg | ORAL_TABLET | Freq: Once | ORAL | Status: AC
  Administered 2022-09-09: 500 mg via ORAL

## 2022-09-09 MED ORDER — KETOROLAC TROMETHAMINE 15 MG/ML IJ SOLN
INTRAMUSCULAR | Status: AC
  Filled 2022-09-09: qty 1

## 2022-09-09 MED ORDER — OXYCODONE HCL 5 MG PO TABS: 5.0000 mg | ORAL_TABLET | ORAL | 0 refills | Status: DC | PRN

## 2022-09-09 MED ORDER — ONDANSETRON HCL 4 MG/2ML IJ SOLN
INTRAMUSCULAR | Status: DC | PRN
  Administered 2022-09-09: 4 mg via INTRAVENOUS

## 2022-09-09 MED ORDER — METHOCARBAMOL 500 MG PO TABS: 500.0000 mg | ORAL_TABLET | Freq: Four times a day (QID) | ORAL | 0 refills | Status: DC | PRN

## 2022-09-09 MED ORDER — DEXAMETHASONE SODIUM PHOSPHATE 10 MG/ML IJ SOLN
INTRAMUSCULAR | Status: DC | PRN
  Administered 2022-09-09: 10 mg via INTRAVENOUS

## 2022-09-09 MED ORDER — BUPIVACAINE-EPINEPHRINE (PF) 0.5% -1:200000 IJ SOLN
INTRAMUSCULAR | Status: AC
  Filled 2022-09-09: qty 30

## 2022-09-09 MED ORDER — PROPOFOL 10 MG/ML IV BOLUS
INTRAVENOUS | Status: DC | PRN
  Administered 2022-09-09: 170 mg via INTRAVENOUS

## 2022-09-09 MED ORDER — METHOCARBAMOL 500 MG PO TABS
ORAL_TABLET | ORAL | Status: AC
  Filled 2022-09-09: qty 1

## 2022-09-09 MED ORDER — ACETAMINOPHEN 10 MG/ML IV SOLN: 1000.0000 mg | Freq: Once | INTRAVENOUS | Status: DC | PRN

## 2022-09-09 MED ORDER — ACETAMINOPHEN 10 MG/ML IV SOLN
INTRAVENOUS | Status: AC
  Filled 2022-09-09: qty 100

## 2022-09-09 MED ORDER — SUGAMMADEX SODIUM 200 MG/2ML IV SOLN
INTRAVENOUS | Status: DC | PRN
  Administered 2022-09-09: 100 mg via INTRAVENOUS

## 2022-09-09 MED ORDER — FENTANYL CITRATE (PF) 100 MCG/2ML IJ SOLN
INTRAMUSCULAR | Status: AC
  Administered 2022-09-09: 25 ug via INTRAVENOUS
  Filled 2022-09-09: qty 2

## 2022-09-09 MED ORDER — VANCOMYCIN HCL IN DEXTROSE 1-5 GM/200ML-% IV SOLN
INTRAVENOUS | Status: AC
  Administered 2022-09-09: 1000 mg via INTRAVENOUS
  Filled 2022-09-09: qty 200

## 2022-09-09 MED ORDER — LIDOCAINE HCL (PF) 2 % IJ SOLN
INTRAMUSCULAR | Status: AC
  Filled 2022-09-09: qty 5

## 2022-09-09 MED ORDER — DEXMEDETOMIDINE HCL IN NACL 200 MCG/50ML IV SOLN
INTRAVENOUS | Status: DC | PRN
  Administered 2022-09-09: 8 ug via INTRAVENOUS

## 2022-09-09 MED ORDER — FENTANYL CITRATE (PF) 100 MCG/2ML IJ SOLN
25.0000 ug | INTRAMUSCULAR | Status: DC | PRN
  Administered 2022-09-09 (×3): 25 ug via INTRAVENOUS

## 2022-09-09 MED ORDER — 0.9 % SODIUM CHLORIDE (POUR BTL) OPTIME
TOPICAL | Status: DC | PRN
  Administered 2022-09-09: 500 mL

## 2022-09-09 MED ORDER — FAMOTIDINE 20 MG PO TABS
ORAL_TABLET | ORAL | Status: AC
  Administered 2022-09-09: 20 mg via ORAL
  Filled 2022-09-09: qty 1

## 2022-09-09 MED ORDER — SURGIFLO WITH THROMBIN (HEMOSTATIC MATRIX KIT) OPTIME
TOPICAL | Status: DC | PRN
  Administered 2022-09-09: 1 via TOPICAL

## 2022-09-09 MED ORDER — FENTANYL CITRATE (PF) 100 MCG/2ML IJ SOLN
INTRAMUSCULAR | Status: DC | PRN
  Administered 2022-09-09 (×2): 50 ug via INTRAVENOUS

## 2022-09-09 MED ORDER — FAMOTIDINE 20 MG PO TABS: 20.0000 mg | ORAL_TABLET | Freq: Once | ORAL | Status: AC

## 2022-09-09 MED ORDER — SUCCINYLCHOLINE CHLORIDE 200 MG/10ML IV SOSY
PREFILLED_SYRINGE | INTRAVENOUS | Status: DC | PRN
  Administered 2022-09-09: 100 mg via INTRAVENOUS

## 2022-09-09 MED ORDER — MIDAZOLAM HCL 2 MG/2ML IJ SOLN
INTRAMUSCULAR | Status: DC | PRN
  Administered 2022-09-09: 2 mg via INTRAVENOUS

## 2022-09-09 MED ORDER — METHOCARBAMOL 1000 MG/10ML IJ SOLN
500.0000 mg | Freq: Once | INTRAVENOUS | Status: DC
  Filled 2022-09-09: qty 5

## 2022-09-09 MED ORDER — ORAL CARE MOUTH RINSE: 15.0000 mL | Freq: Once | OROMUCOSAL | Status: AC

## 2022-09-09 MED ORDER — OXYCODONE HCL 5 MG/5ML PO SOLN: 5.0000 mg | Freq: Once | ORAL | Status: DC | PRN

## 2022-09-09 SURGICAL SUPPLY — 55 items
ADH SKN CLS APL DERMABOND .7 (GAUZE/BANDAGES/DRESSINGS) ×1
AGENT HMST KT MTR STRL THRMB (HEMOSTASIS) ×1
APL PRP STRL LF DISP 70% ISPRP (MISCELLANEOUS) ×2
BUR NEURO DRILL SOFT 3.0X3.8M (BURR) ×2 IMPLANT
CHLORAPREP W/TINT 26 (MISCELLANEOUS) ×4 IMPLANT
COUNTER NEEDLE 20/40 LG (NEEDLE) ×2 IMPLANT
DERMABOND ADVANCED .7 DNX12 (GAUZE/BANDAGES/DRESSINGS) ×2 IMPLANT
DISC MOBI-C CERVICAL 13X17 H5 (Miscellaneous) IMPLANT
DRAPE C ARM PK CFD 31 SPINE (DRAPES) ×2 IMPLANT
DRAPE LAPAROTOMY 77X122 PED (DRAPES) ×2 IMPLANT
DRAPE MICROSCOPE SPINE 48X150 (DRAPES) ×2 IMPLANT
DRAPE SURG 17X11 SM STRL (DRAPES) ×2 IMPLANT
ELECT CAUTERY BLADE TIP 2.5 (TIP) ×1
ELECT REM PT RETURN 9FT ADLT (ELECTROSURGICAL) ×1
ELECTRODE CAUTERY BLDE TIP 2.5 (TIP) ×2 IMPLANT
ELECTRODE REM PT RTRN 9FT ADLT (ELECTROSURGICAL) ×2 IMPLANT
FEE INTRAOP CADWELL SUPPLY NCS (MISCELLANEOUS) IMPLANT
FEE INTRAOP MONITOR IMPULS NCS (MISCELLANEOUS) IMPLANT
GLOVE BIOGEL PI IND STRL 6.5 (GLOVE) ×2 IMPLANT
GLOVE BIOGEL PI IND STRL 8.5 (GLOVE) ×2 IMPLANT
GLOVE SURG SYN 6.5 ES PF (GLOVE) ×1 IMPLANT
GLOVE SURG SYN 6.5 PF PI (GLOVE) ×2 IMPLANT
GLOVE SURG SYN 8.5  E (GLOVE) ×3
GLOVE SURG SYN 8.5 E (GLOVE) ×3 IMPLANT
GLOVE SURG SYN 8.5 PF PI (GLOVE) ×6 IMPLANT
GOWN SRG LRG LVL 4 IMPRV REINF (GOWNS) ×2 IMPLANT
GOWN SRG XL LVL 3 NONREINFORCE (GOWNS) ×2 IMPLANT
GOWN STRL NON-REIN TWL XL LVL3 (GOWNS) ×1
GOWN STRL REIN LRG LVL4 (GOWNS) ×1
GRADUATE 1200CC STRL 31836 (MISCELLANEOUS) ×2 IMPLANT
INTRAOP CADWELL SUPPLY FEE NCS (MISCELLANEOUS)
INTRAOP DISP SUPPLY FEE NCS (MISCELLANEOUS)
INTRAOP MONITOR FEE IMPULS NCS (MISCELLANEOUS)
INTRAOP MONITOR FEE IMPULSE (MISCELLANEOUS)
KIT TURNOVER KIT A (KITS) ×2 IMPLANT
MANIFOLD NEPTUNE II (INSTRUMENTS) ×2 IMPLANT
MARKER SKIN DUAL TIP RULER LAB (MISCELLANEOUS) ×4 IMPLANT
NDL SAFETY ECLIP 18X1.5 (MISCELLANEOUS) IMPLANT
NS IRRIG 1000ML POUR BTL (IV SOLUTION) ×2 IMPLANT
NS IRRIG 500ML POUR BTL (IV SOLUTION) IMPLANT
PACK LAMINECTOMY NEURO (CUSTOM PROCEDURE TRAY) ×2 IMPLANT
PAD ARMBOARD 7.5X6 YLW CONV (MISCELLANEOUS) ×2 IMPLANT
PIN CASPAR SPINAL 12MM (PIN) IMPLANT
SOLUTION IRRIG SURGIPHOR (IV SOLUTION) ×2 IMPLANT
SPONGE KITTNER 5P (MISCELLANEOUS) ×2 IMPLANT
STAPLER SKIN PROX 35W (STAPLE) IMPLANT
SURGIFLO W/THROMBIN 8M KIT (HEMOSTASIS) ×2 IMPLANT
SUT DVC VLOC 3-0 CL 6 P-12 (SUTURE) IMPLANT
SUT VIC AB 3-0 SH 8-18 (SUTURE) ×2 IMPLANT
SYR 30ML LL (SYRINGE) ×2 IMPLANT
TAPE CLOTH 3X10 WHT NS LF (GAUZE/BANDAGES/DRESSINGS) ×2 IMPLANT
TOWEL OR 17X26 4PK STRL BLUE (TOWEL DISPOSABLE) ×6 IMPLANT
TRAP FLUID SMOKE EVACUATOR (MISCELLANEOUS) ×2 IMPLANT
TRAY FOLEY MTR SLVR 16FR STAT (SET/KITS/TRAYS/PACK) IMPLANT
TUBING CONNECTING 10 (TUBING) ×2 IMPLANT

## 2022-09-09 NOTE — Interval H&P Note (Signed)
History and Physical Interval Note:  09/09/2022 6:56 AM  Lindsay Parsons  has presented today for surgery, with the diagnosis of M54.12 cervical radiculopathy.  The various methods of treatment have been discussed with the patient and family. After consideration of risks, benefits and other options for treatment, the patient has consented to  Procedure(s): C6-7 ARTHROPLASTY (N/A) as a surgical intervention.  The patient's history has been reviewed, patient examined, no change in status, stable for surgery.  I have reviewed the patient's chart and labs.  Questions were answered to the patient's satisfaction.    Heart sounds normal no MRG. Chest Clear to Auscultation Bilaterally.   Korrine Sicard

## 2022-09-09 NOTE — Progress Notes (Signed)
Patient reports decreased itching and burning to scalp after Benadryl administration.

## 2022-09-09 NOTE — Discharge Instructions (Addendum)
Your surgeon has performed an operation on your cervical spine (neck) to relieve pressure on the spinal cord and/or nerves. This involved making an incision in the front of your neck and removing one or more of the discs that support your spine. Next, a cervical disc replacement was put in.   The following are instructions to help in your recovery once you have been discharged from the hospital. Even if you feel well, it is important that you follow these activity guidelines. If you do not let your neck heal properly from the surgery, you can increase the chance of return of your symptoms and other complications.  * Take motrin '800mg'$  three times a day with food regularly for the next 2 weeks, then you can take as needed.   Activity    No bending, lifting, or twisting ("BLT"). Avoid lifting objects heavier than 10 pounds (gallon milk jug).  Where possible, avoid household activities that involve lifting, bending, reaching, pushing, or pulling such as laundry, vacuuming, grocery shopping, and childcare. Try to arrange for help from friends and family for these activities while your back heals.  Increase physical activity slowly as tolerated.  Taking short walks is encouraged, but avoid strenuous exercise. Do not jog, run, bicycle, lift weights, or participate in any other exercises unless specifically allowed by your doctor.  Talk to your doctor before resuming sexual activity.  You should not drive until cleared by your doctor.  Until released by your doctor, you should not return to work or school.  You should rest at home and let your body heal.   You may shower three days after your surgery.  After showering, lightly dab your incision dry. Do not take a tub bath or go swimming until approved by your doctor at your follow-up appointment.  If your doctor ordered a cervical collar (neck brace) for you, you should wear it whenever you are out of bed. You may remove it when lying down or sleeping,  but you should wear it at all other times. Not all neck surgeries require a cervical collar.  If you smoke, we strongly recommend that you quit.  Smoking has been proven to interfere with normal bone healing and will dramatically reduce the success rate of your surgery. Please contact [facility] (800-QUIT-NOW) and use the resources at www.[facility].com for assistance in stopping smoking.  Surgical Incision   If you have a dressing on your incision, you may remove it two days after your surgery. Keep your incision area clean and dry.  You have Dermabond glue over your incision. The . The glue should begin to peel away within about a week.   Diet           You may return to your usual diet. However, you may experience discomfort when swallowing in the first month after your surgery. This is normal. You may find that softer foods are more comfortable for you to swallow. Be sure to stay hydrated.  When to Contact us  You may experience pain in your neck and/or pain between your shoulder blades. This is normal and should improve in the next few weeks with the help of pain medication, muscle relaxers, and rest. Some patients report that a warm compress on the back of the neck or between the shoulder blades helps.  However, should you experience any of the following, contact us immediately: New numbness or weakness Pain that is progressively getting worse, and is not relieved by your pain medication, muscle relaxers, rest,  and warm compresses Bleeding, redness, swelling, pain, or drainage from surgical incision Chills or flu-like symptoms Fever greater than 101.0 F (38.3 C) Inability to eat, drink fluids, or take medications Problems with bowel or bladder functions Difficulty breathing or shortness of breath Warmth, tenderness, or swelling in your calf Contact Information During office hours (Monday-Friday 9 am to 5 pm), please call your physician at [phone] and ask for Berdine Addison After hours and weekends, please call [phone] and speak with the neurosurgeon on call For a life-threatening emergency, call [facility]   The drugs that you were given will stay in your system until tomorrow so for the next 24 hours you should not:  Drive an automobile Make any legal decisions Drink any alcoholic beverage   You may resume regular meals tomorrow.  Today it is better to start with liquids and gradually work up to solid foods.  You may eat anything you prefer, but it is better to start with liquids, then soup and crackers, and gradually work up to solid foods.   Please notify your doctor immediately if you have any unusual bleeding, trouble breathing, redness and pain at the surgery site, drainage, fever, or pain not relieved by medication.     Your post-operative visit with Dr.                                       is: Date:                        Time:    Please call to schedule your post-operative visit.  Additional Instructions:

## 2022-09-09 NOTE — Op Note (Signed)
Indications: Lindsay Parsons is a 36 yo female who presented with: M54.12 cervical radiculopathy   She failed conservative management prompting surgical intervention  Findings: disc herniation C6-7  Preoperative Diagnosis: M54.12 cervical radiculopathy  Postoperative Diagnosis: same   EBL: 20 ml IVF: see AR ml Drains: none Disposition: Extubated and Stable to PACU Complications: none  No foley catheter was placed.   Preoperative Note:   Risks of surgery discussed include: infection, bleeding, stroke, coma, death, paralysis, CSF leak, nerve/spinal cord injury, numbness, tingling, weakness, complex regional pain syndrome, recurrent stenosis and/or disc herniation, vascular injury, development of instability, neck/back pain, need for further surgery, persistent symptoms, development of deformity, and the risks of anesthesia. The patient understood these risks and agreed to proceed.  Operative Note:  Procedure:  1) Cervical Disc Arthroplasty at C6/7 using a LDR Mobi-C device   Procedure: After obtaining informed consent, the patient taken to the operating room, placed in supine position, general anesthesia induced.  The patient had a small shoulder roll placed behind the neck.  The patient received preop antibiotics and IV Decadron.  The patient had a neck incision outlined, was prepped and draped in usual sterile fashion. The incision was injected with local anesthetic.   An incision was opened, dissection taken down medial to the carotid artery and jugular vein, lateral to the trachea and esophagus.  The prevertebral fascia identified and a localizing x-ray demonstrated the correct level.  The longus colli were dissected laterally, and self-retaining retractors placed to open the operative field. The microscope was then brought into the field.  With this complete, distractor pins were placed in the vertebral bodies of C6 and C7. The distractor was placed, and the anulus at C6/7 was opened  using a bovie.  Curettes and pituitary rongeurs used to remove the majority of disk, then the drill was used to remove the posterior osteophyte and begin the foraminotomies. The nerve hook was used to elevate the posterior longitudinal ligament, which was then removed with Kerrison rongeurs. A right-sided disc herniation was removed until the R C7 nerve root was visibly decompressed.The microblunt nerve hook could be passed out the foramen bilaterally.   Meticulous hemostasis was obtained.  A trial spacer was used to size the disc space. Using flouroscopic guidance, a 17 mm width x 13 mm depth x 5 mm height Mobi-C was then inserted in the prepared disc space.  The caspar distractor was removed, and bone wax used for hemostasis. Final AP and lateral radiographs were taken.   With the disc arthroplasty in good position, the wound was irrigated copiously and meticulous hemostasis obtained.  Wound was closed in 2 layers using interrupted inverted 3-0 Vicryl sutures.  The wound was dressed with dermabond, the head of bed at 30 degrees, taken to recovery room in stable condition.  No new postop neurological deficits were identified.  Sponge and pattie counts were correct at the end of the procedure.     I performed the entire procedure with the assistance of Geronimo Boot PA as an Pensions consultant. An assistant was required for this procedure due to the complexity.  The assistant provided assistance in tissue manipulation and suction, and was required for the successful and safe performance of the procedure. I performed the critical portions of the procedure.   Meade Maw MD

## 2022-09-09 NOTE — Progress Notes (Addendum)
Patient c/o burning and itching to scalp. +redness to scalp. Notified Dr. Erenest Rasher. Order received to stop Vancomycin and administer Benadryl.

## 2022-09-09 NOTE — Anesthesia Procedure Notes (Signed)
Procedure Name: Intubation Date/Time: 09/09/2022 8:38 AM  Performed by: Jonna Clark, CRNAPre-anesthesia Checklist: Patient identified, Patient being monitored, Timeout performed, Emergency Drugs available and Suction available Patient Re-evaluated:Patient Re-evaluated prior to induction Oxygen Delivery Method: Circle system utilized Preoxygenation: Pre-oxygenation with 100% oxygen Induction Type: IV induction Ventilation: Mask ventilation without difficulty Laryngoscope Size: 3 and McGraph Grade View: Grade I Tube type: Oral Tube size: 6.5 mm Number of attempts: 1 Airway Equipment and Method: Stylet Placement Confirmation: ETT inserted through vocal cords under direct vision, positive ETCO2 and breath sounds checked- equal and bilateral Secured at: 20 cm Tube secured with: Tape Dental Injury: Teeth and Oropharynx as per pre-operative assessment

## 2022-09-09 NOTE — Anesthesia Preprocedure Evaluation (Addendum)
Anesthesia Evaluation  Patient identified by MRN, date of birth, ID band Patient awake    Reviewed: Allergy & Precautions, NPO status , Patient's Chart, lab work & pertinent test results  History of Anesthesia Complications Negative for: history of anesthetic complications  Airway Mallampati: I   Neck ROM: Full    Dental no notable dental hx.    Pulmonary neg pulmonary ROS,    Pulmonary exam normal breath sounds clear to auscultation       Cardiovascular hypertension, Normal cardiovascular exam+ dysrhythmias Supra Ventricular Tachycardia  Rhythm:Regular Rate:Normal     Neuro/Psych  Headaches,    GI/Hepatic negative GI ROS,   Endo/Other  Obesity   Renal/GU Renal disease (nephrolithiasis)     Musculoskeletal   Abdominal   Peds  Hematology negative hematology ROS (+)   Anesthesia Other Findings   Reproductive/Obstetrics                            Anesthesia Physical Anesthesia Plan  ASA: 2  Anesthesia Plan: General   Post-op Pain Management:    Induction: Intravenous  PONV Risk Score and Plan: 3 and Ondansetron, Dexamethasone and Treatment may vary due to age or medical condition  Airway Management Planned: Oral ETT  Additional Equipment:   Intra-op Plan:   Post-operative Plan: Extubation in OR  Informed Consent: I have reviewed the patients History and Physical, chart, labs and discussed the procedure including the risks, benefits and alternatives for the proposed anesthesia with the patient or authorized representative who has indicated his/her understanding and acceptance.     Dental advisory given  Plan Discussed with: CRNA  Anesthesia Plan Comments: (Patient consented for risks of anesthesia including but not limited to:  - adverse reactions to medications - damage to eyes, teeth, lips or other oral mucosa - nerve damage due to positioning  - sore throat or  hoarseness - damage to heart, brain, nerves, lungs, other parts of body or loss of life  Informed patient about role of CRNA in peri- and intra-operative care.  Patient voiced understanding.)        Anesthesia Quick Evaluation

## 2022-09-09 NOTE — Anesthesia Postprocedure Evaluation (Signed)
Anesthesia Post Note  Patient: Lindsay Parsons  Procedure(s) Performed: C6-7 ARTHROPLASTY (Neck)  Patient location during evaluation: PACU Anesthesia Type: General Level of consciousness: awake and alert, oriented and patient cooperative Pain management: pain level controlled Vital Signs Assessment: post-procedure vital signs reviewed and stable Respiratory status: spontaneous breathing, nonlabored ventilation and respiratory function stable Cardiovascular status: blood pressure returned to baseline and stable Postop Assessment: adequate PO intake Anesthetic complications: no   No notable events documented.   Last Vitals:  Vitals:   09/09/22 1035 09/09/22 1045  BP:  122/86  Pulse: 78 67  Resp: 20 16  Temp:    SpO2: 96% 96%    Last Pain:  Vitals:   09/09/22 1045  TempSrc:   PainSc: [place]

## 2022-09-09 NOTE — Transfer of Care (Signed)
Immediate Anesthesia Transfer of Care Note  Patient: Lindsay Parsons  Procedure(s) Performed: C6-7 ARTHROPLASTY (Neck)  Patient Location: PACU  Anesthesia Type:General  Level of Consciousness: drowsy and patient cooperative  Airway & Oxygen Therapy: Patient Spontanous Breathing  Post-op Assessment: Report given to RN and Post -op Vital signs reviewed and stable  Post vital signs: Reviewed and stable  Last Vitals:  Vitals Value Taken Time  BP 115/81 09/09/22 1015  Temp 36.5 C 09/09/22 1009  Pulse 76 09/09/22 1016  Resp 17 09/09/22 1016  SpO2 93 % 09/09/22 1016  Vitals shown include unvalidated device data.  Last Pain:  Vitals:   09/09/22 1009  TempSrc:   PainSc: Asleep         Complications: No notable events documented.

## 2022-09-09 NOTE — Discharge Summary (Signed)
Physician Discharge Summary  Patient ID: Lindsay Parsons MRN: [id number] DOB/AGE: 1986/09/24 36 y.o.  Admit date: 09/09/2022 Discharge date: 09/09/2022  Admission Diagnoses: cervical radiculopathy  Discharge Diagnoses:  Cervical radiculopathy  Discharged Condition: good  Hospital Course: She was admitted on 09/09/22 for C6-C7 arthroplasty. She tolerated the procedure well and was found stable for discharge home on the day of surgery.   She was discharged on oxycodone, robaxin, and motrin. Advised to take motrin regularly x 2 weeks.   Consults: none  Significant Diagnostic Studies: xrays for localization in OR  Treatments: surgery: C6-C7 arthroplasty  Discharge Exam: Blood pressure 112/70, pulse 81, temperature 97.7 F (36.5 C), resp. rate 14, height '5\' 3"'$  (1.6 m), weight 81.6 kg, last menstrual period 09/05/2022, SpO2 99 %. She is alert. Incision is clean and dry. She is moving her bilateral Upper/Lower extremities. Trachea is midline.   Disposition: Discharge disposition: 01-Home or Self Care       Discharge Instructions     Discharge patient   Complete by: As directed    Discharge disposition: 01-Home or Self Care   Discharge patient date: 09/09/2022      Allergies as of 09/09/2022       Reactions   Cephalosporins Hives, Itching   Nylon Hives   hives/petechiae that resolved after nylon sutures were removed        Medication List     TAKE these medications    cyanocobalamin 1000 MCG/ML injection Commonly known as: VITAMIN B12 Inject 1 mL (1,000 mcg total) into the muscle monthly   ibuprofen 800 MG tablet Commonly known as: ADVIL Take 1 tablet (800 mg total) by mouth with breakfast, with lunch, and with evening meal.   methocarbamol 500 MG tablet Commonly known as: ROBAXIN Take 1 tablet (500 mg total) by mouth every 6 (six) hours as needed for muscle spasms.   ondansetron 8 MG disintegrating tablet Commonly known as: ZOFRAN-ODT Dissolve 1  tablet (8 mg total) on the tongue every 8 (eight) hours as needed (Take 1 tablet (8 mg total) by mouth every 8 (eight) hours as needed for up to 360 days)   oxyCODONE 5 MG immediate release tablet Commonly known as: Oxy IR/ROXICODONE Take 1-2 tablets (5-10 mg total) by mouth every 4 (four) hours as needed for severe pain.   propranolol 20 MG tablet Commonly known as: INDERAL Take 1 &1/2 tablets ('30mg'$ ) by mouth twice a day (Take 1.5 tablets (30 mg total) by mouth 2 (two) times daily for 360 days)   rizatriptan 10 MG disintegrating tablet Commonly known as: MAXALT-MLT Dissolve 1 tablet (10 mg total) on the tongue as directed (Take 1 tablet (10 mg total) by mouth as directed for up to 360 doses)        Follow-up Information     Geronimo Boot, PA-C Follow up.   Specialty: Neurosurgery Why: Keep your scheduled postop appointment on 09/22/22. Contact information: [address] [place] [address] [phone]                 Signed: Jannett Celestine 09/09/2022, 10:14 AM

## 2022-09-10 ENCOUNTER — Encounter: Payer: Self-pay | Admitting: Neurosurgery

## 2022-09-10 ENCOUNTER — Encounter: Payer: No Typology Code available for payment source | Admitting: Orthopedic Surgery

## 2022-09-21 NOTE — Progress Notes (Unsigned)
   REFERRING PHYSICIAN:  Maryland Pink, Md [address] [place],  [place] [address]  DOS: 09/09/22  Cervical Disc Arthroplasty at C6-C7 using a LDR Mobi-C device   HISTORY OF PRESENT ILLNESS: Lindsay Parsons is 2 weeks status post Cervical Disc Arthroplasty at C6-C7. Given oxycodone and robaxin on discharge from the hospital. Advised to take regular motrin x 2 weeks.   She continues with some mild neck pain that is worse at night. Her preop right arm pain is better. She is not taking oxycodone. She is taking prn robaxin. Not sleeping well at night due to discomfort.    PHYSICAL EXAMINATION:  NEUROLOGICAL:  General: In no acute distress.   Awake, alert, oriented to person, place, and time.  Pupils equal round and reactive to light.  Facial tone is symmetric.    Strength: Side Biceps Triceps Deltoid Interossei Grip Wrist Ext. Wrist Flex.  R '5 5 5 5 5 5 5  '$ L '5 5 5 5 5 5 5   '$ Incision c/d/i  Imaging:  Nothing new to review.   Assessment / Plan: Lindsay Parsons is doing well s/p above surgery. Treatment options reviewed with patient and following plan made:   - We discussed activity escalation and I have advised the patient to lift up to 10 pounds until 6 weeks after surgery (until your follow up with Dr. Izora Ribas).   - Reviewed wound care.  - Continue current medications including prn robaxin and motrin. No refills needed.  - Okay to get a massage per Dr. Izora Ribas.  - Follow up as scheduled in 4 weeks and prn.   Advised to contact the office if any questions or concerns arise.   Geronimo Boot PA-C Dept of Neurosurgery

## 2022-09-22 ENCOUNTER — Encounter: Payer: Self-pay | Admitting: Orthopedic Surgery

## 2022-09-22 ENCOUNTER — Ambulatory Visit (INDEPENDENT_AMBULATORY_CARE_PROVIDER_SITE_OTHER): Payer: No Typology Code available for payment source | Admitting: Orthopedic Surgery

## 2022-09-22 VITALS — BP 118/78 | Temp 97.9°F | Ht 63.0 in | Wt 180.0 lb

## 2022-09-22 DIAGNOSIS — M5412 Radiculopathy, cervical region: Secondary | ICD-10-CM

## 2022-09-22 DIAGNOSIS — M5010 Cervical disc disorder with radiculopathy, unspecified cervical region: Secondary | ICD-10-CM

## 2022-09-22 DIAGNOSIS — Z9889 Other specified postprocedural states: Secondary | ICD-10-CM

## 2022-09-22 DIAGNOSIS — Z09 Encounter for follow-up examination after completed treatment for conditions other than malignant neoplasm: Secondary | ICD-10-CM

## 2022-09-25 ENCOUNTER — Other Ambulatory Visit: Payer: Self-pay

## 2022-09-27 ENCOUNTER — Other Ambulatory Visit: Payer: Self-pay

## 2022-10-06 ENCOUNTER — Encounter: Payer: No Typology Code available for payment source | Admitting: Neurosurgery

## 2022-10-19 ENCOUNTER — Other Ambulatory Visit: Payer: Self-pay

## 2022-10-19 ENCOUNTER — Ambulatory Visit
Admission: RE | Admit: 2022-10-19 | Discharge: 2022-10-19 | Disposition: A | Discharge status: Home / Self Care | Payer: No Typology Code available for payment source | Attending: Neurosurgery | Admitting: Neurosurgery

## 2022-10-19 ENCOUNTER — Telehealth: Payer: Self-pay

## 2022-10-19 ENCOUNTER — Ambulatory Visit
Admission: RE | Admit: 2022-10-19 | Discharge: 2022-10-19 | Disposition: A | Discharge status: Home / Self Care | Payer: No Typology Code available for payment source | Source: Ambulatory Visit | Attending: Neurosurgery | Admitting: Neurosurgery

## 2022-10-19 DIAGNOSIS — Z9889 Other specified postprocedural states: Secondary | ICD-10-CM

## 2022-10-19 MED ORDER — METHYLPREDNISOLONE 4 MG PO TBPK
ORAL_TABLET | ORAL | 0 refills | Status: DC
  Filled 2022-10-19: qty 21, 6d supply, fill #0

## 2022-10-19 MED ORDER — CYANOCOBALAMIN 1000 MCG/ML IJ SOLN
INTRAMUSCULAR | 11 refills | Status: AC
  Filled 2022-10-19: qty 1, 30d supply, fill #0
  Filled 2022-11-30 – 2022-12-09 (×3): qty 1, 30d supply, fill #1
  Filled 2023-01-21: qty 1, 30d supply, fill #2
  Filled 2023-02-22: qty 1, 30d supply, fill #3
  Filled 2023-03-29: qty 1, 30d supply, fill #4
  Filled 2023-05-25: qty 1, 30d supply, fill #5
  Filled 2023-08-03: qty 1, 30d supply, fill #6

## 2022-10-19 NOTE — Telephone Encounter (Signed)
Pt contacted Dr Izora Ribas regarding left sided pain that is worsening. She has tried motrin, robaxin, heat, ice without relief. Sent medrol dosepack per discussion with Dr Izora Ribas. Left voicemail on pt's personal voicemail.

## 2022-10-20 ENCOUNTER — Ambulatory Visit (INDEPENDENT_AMBULATORY_CARE_PROVIDER_SITE_OTHER): Payer: No Typology Code available for payment source | Admitting: Neurosurgery

## 2022-10-20 ENCOUNTER — Encounter: Payer: No Typology Code available for payment source | Admitting: Neurosurgery

## 2022-10-20 ENCOUNTER — Encounter: Payer: Self-pay | Admitting: Neurosurgery

## 2022-10-20 VITALS — BP 117/84 | HR 81 | Temp 97.8°F | Wt 182.4 lb

## 2022-10-20 DIAGNOSIS — Z09 Encounter for follow-up examination after completed treatment for conditions other than malignant neoplasm: Secondary | ICD-10-CM

## 2022-10-20 DIAGNOSIS — M5412 Radiculopathy, cervical region: Secondary | ICD-10-CM

## 2022-10-20 DIAGNOSIS — Z9889 Other specified postprocedural states: Secondary | ICD-10-CM

## 2022-10-20 NOTE — Progress Notes (Signed)
   REFERRING PHYSICIAN:  Maryland Pink, Md [address] [place],  [place] [address]  DOS: 09/09/22  Cervical Disc Arthroplasty at C6-C7 using a LDR Mobi-C device   HISTORY OF PRESENT ILLNESS: Lindsay Parsons is status post Cervical Disc Arthroplasty at C6-C7.  Her right arm is doing very well.  She is having some issues with her carpal tunnel syndrome.  She has a small area pain in her left lower neck and between her neck and shoulder blade.  PHYSICAL EXAMINATION:  NEUROLOGICAL:  General: In no acute distress.   Awake, alert, oriented to person, place, and time.  Pupils equal round and reactive to light.  Facial tone is symmetric.    Strength: Side Biceps Triceps Deltoid Interossei Grip Wrist Ext. Wrist Flex.  R '5 5 5 5 5 5 5  '$ L '5 5 5 5 5 5 5   '$ Incision c/d/i  Imaging:  No complications noted  Assessment / Plan: Lindsay Parsons is doing well s/p above surgery.   She is having some musculoskeletal pain.  If she does not improve with prednisone and exercises, we will discuss trigger point injections.  She is released to return to work.  We discussed her other activity limitations.  Meade Maw MD Dept of Neurosurgery

## 2022-10-30 ENCOUNTER — Other Ambulatory Visit: Payer: Self-pay

## 2022-10-30 MED ORDER — MOUNJARO 2.5 MG/0.5ML ~~LOC~~ SOAJ
SUBCUTANEOUS | 0 refills | Status: DC
  Filled 2022-10-30 – 2022-11-03 (×2): qty 2, 28d supply, fill #0

## 2022-11-03 ENCOUNTER — Other Ambulatory Visit: Payer: Self-pay

## 2022-11-18 ENCOUNTER — Other Ambulatory Visit: Payer: Self-pay

## 2022-11-19 ENCOUNTER — Encounter: Payer: No Typology Code available for payment source | Admitting: Neurosurgery

## 2022-11-30 ENCOUNTER — Other Ambulatory Visit: Payer: Self-pay

## 2022-12-01 ENCOUNTER — Encounter: Payer: No Typology Code available for payment source | Admitting: Neurosurgery

## 2022-12-01 ENCOUNTER — Encounter (HOSPITAL_COMMUNITY): Payer: Self-pay

## 2022-12-01 ENCOUNTER — Other Ambulatory Visit (HOSPITAL_COMMUNITY): Payer: Self-pay

## 2022-12-04 ENCOUNTER — Ambulatory Visit
Admission: RE | Admit: 2022-12-04 | Discharge: 2022-12-04 | Disposition: A | Discharge status: Home / Self Care | Payer: 59 | Source: Ambulatory Visit | Attending: Orthopedic Surgery | Admitting: Orthopedic Surgery

## 2022-12-04 ENCOUNTER — Other Ambulatory Visit: Payer: Self-pay

## 2022-12-04 ENCOUNTER — Other Ambulatory Visit: Payer: Self-pay | Admitting: Orthopedic Surgery

## 2022-12-04 DIAGNOSIS — Z9889 Other specified postprocedural states: Secondary | ICD-10-CM

## 2022-12-04 DIAGNOSIS — M47812 Spondylosis without myelopathy or radiculopathy, cervical region: Secondary | ICD-10-CM | POA: Diagnosis not present

## 2022-12-04 DIAGNOSIS — M5412 Radiculopathy, cervical region: Secondary | ICD-10-CM

## 2022-12-04 NOTE — Progress Notes (Unsigned)
   REFERRING PHYSICIAN:  Maryland Pink, Md [address] [place],  [place] [address]  DOS: 09/09/22  Cervical Disc Arthroplasty at C6-C7 using a LDR Mobi-C device   HISTORY OF PRESENT ILLNESS: Lindsay Parsons is almost 3 months status post Cervical Disc Arthroplasty at C6-C7.   Was having some musculoskeletal pain at her last visit. He recommended exercises and prednisone. She was released to return to work.   The sharp pain she was having at her last visit is gone. She notes some intermittent stiffness in her neck. No arm pain. She has numbness/tingling in her right hand. She has known carpal tunnel syndrome and is seeing ortho for this. She is doing well being back at work.    PHYSICAL EXAMINATION:  NEUROLOGICAL:  General: In no acute distress.   Awake, alert, oriented to person, place, and time.  Pupils equal round and reactive to light.  Facial tone is symmetric.    Strength: Side Biceps Triceps Deltoid Interossei Grip Wrist Ext. Wrist Flex.  R '5 5 5 5 5 5 5  '$ L '5 5 5 5 5 5 5   '$ Incision well healed.   Imaging:  Cervical spine xrays dated 12/04/22:  FINDINGS: No fracture, dislocation or subluxation. No spondylolisthesis. No osteolytic or osteoblastic changes. Prevertebral and cervical cranial soft tissues are unremarkable.   Degenerative disc disease noted with disc space narrowing and marginal osteophytes at C5-C6. Patient is status post discectomy at C6-C7 with spacer placement.   IMPRESSION: Degenerative changes. Postop discectomy C6-C7. No acute osseous abnormalities.     Electronically Signed   By: Sammie Bench M.D.   On: 12/04/2022 20:22  I have personally reviewed the images and agree with the above interpretation.   Assessment / Plan: Lindsay Parsons is doing well s/p above surgery. Treatment options reviewed with patient and following plan made:   - She can increase lifting as tolerated.  - Continue current medications  including prn motrin.  - Follow up with ortho for carpal tunnel on right.  - Okay to resume running slowly. Reviewed with Dr. Izora Ribas after her visit. Message sent to her.  - Follow up in 6 months and prn. Will need cervical xrays prior to that visit. If doing well then can be released to f/u prn.   Advised to contact the office if any questions or concerns arise.   Geronimo Boot PA-C Dept of Neurosurgery

## 2022-12-09 ENCOUNTER — Other Ambulatory Visit (HOSPITAL_COMMUNITY): Payer: Self-pay

## 2022-12-09 ENCOUNTER — Ambulatory Visit (INDEPENDENT_AMBULATORY_CARE_PROVIDER_SITE_OTHER): Payer: 59 | Admitting: Orthopedic Surgery

## 2022-12-09 ENCOUNTER — Encounter: Payer: Self-pay | Admitting: Orthopedic Surgery

## 2022-12-09 ENCOUNTER — Other Ambulatory Visit (HOSPITAL_BASED_OUTPATIENT_CLINIC_OR_DEPARTMENT_OTHER): Payer: Self-pay

## 2022-12-09 VITALS — BP 117/72 | Temp 97.8°F | Ht 63.0 in | Wt 182.2 lb

## 2022-12-09 DIAGNOSIS — Z9889 Other specified postprocedural states: Secondary | ICD-10-CM

## 2022-12-09 DIAGNOSIS — M5010 Cervical disc disorder with radiculopathy, unspecified cervical region: Secondary | ICD-10-CM

## 2022-12-09 DIAGNOSIS — Z09 Encounter for follow-up examination after completed treatment for conditions other than malignant neoplasm: Secondary | ICD-10-CM

## 2022-12-11 DIAGNOSIS — G5601 Carpal tunnel syndrome, right upper limb: Secondary | ICD-10-CM | POA: Diagnosis not present

## 2022-12-12 ENCOUNTER — Other Ambulatory Visit (HOSPITAL_COMMUNITY): Payer: Self-pay

## 2023-01-08 DIAGNOSIS — M76822 Posterior tibial tendinitis, left leg: Secondary | ICD-10-CM | POA: Diagnosis not present

## 2023-01-21 ENCOUNTER — Other Ambulatory Visit (HOSPITAL_COMMUNITY): Payer: Self-pay

## 2023-01-26 DIAGNOSIS — M76822 Posterior tibial tendinitis, left leg: Secondary | ICD-10-CM | POA: Diagnosis not present

## 2023-02-03 DIAGNOSIS — R110 Nausea: Secondary | ICD-10-CM | POA: Diagnosis not present

## 2023-02-03 DIAGNOSIS — R5381 Other malaise: Secondary | ICD-10-CM | POA: Diagnosis not present

## 2023-02-03 DIAGNOSIS — R5383 Other fatigue: Secondary | ICD-10-CM | POA: Diagnosis not present

## 2023-02-03 DIAGNOSIS — R000 Tachycardia, unspecified: Secondary | ICD-10-CM | POA: Diagnosis not present

## 2023-02-03 DIAGNOSIS — B259 Cytomegaloviral disease, unspecified: Secondary | ICD-10-CM | POA: Diagnosis not present

## 2023-02-03 DIAGNOSIS — Z79899 Other long term (current) drug therapy: Secondary | ICD-10-CM | POA: Diagnosis not present

## 2023-02-03 DIAGNOSIS — E669 Obesity, unspecified: Secondary | ICD-10-CM | POA: Diagnosis not present

## 2023-02-08 DIAGNOSIS — M76822 Posterior tibial tendinitis, left leg: Secondary | ICD-10-CM | POA: Diagnosis not present

## 2023-02-09 ENCOUNTER — Other Ambulatory Visit: Payer: Self-pay

## 2023-02-09 ENCOUNTER — Encounter: Payer: Self-pay | Admitting: Pharmacist

## 2023-02-09 MED ORDER — RYBELSUS 3 MG PO TABS
3.0000 mg | ORAL_TABLET | Freq: Every day | ORAL | 0 refills | Status: DC
  Filled 2023-02-09: qty 30, 30d supply, fill #0

## 2023-02-17 DIAGNOSIS — M76822 Posterior tibial tendinitis, left leg: Secondary | ICD-10-CM | POA: Diagnosis not present

## 2023-02-19 ENCOUNTER — Other Ambulatory Visit: Payer: Self-pay

## 2023-02-19 MED ORDER — QSYMIA 3.75-23 MG PO CP24
1.0000 | ORAL_CAPSULE | Freq: Every morning | ORAL | 0 refills | Status: DC
  Filled 2023-02-19 – 2023-02-24 (×4): qty 14, 14d supply, fill #0

## 2023-02-19 MED ORDER — QSYMIA 7.5-46 MG PO CP24
1.0000 | ORAL_CAPSULE | Freq: Every morning | ORAL | 3 refills | Status: DC
  Filled 2023-02-24 – 2023-02-25 (×2): qty 30, 30d supply, fill #0

## 2023-02-22 ENCOUNTER — Other Ambulatory Visit: Payer: Self-pay

## 2023-02-22 DIAGNOSIS — J0100 Acute maxillary sinusitis, unspecified: Secondary | ICD-10-CM | POA: Diagnosis not present

## 2023-02-22 MED ORDER — PROMETHAZINE-DM 6.25-15 MG/5ML PO SYRP
ORAL_SOLUTION | ORAL | 0 refills | Status: DC
  Filled 2023-02-22: qty 118, 7d supply, fill #0

## 2023-02-22 MED ORDER — PREDNISONE 10 MG PO TABS
10.0000 mg | ORAL_TABLET | Freq: Every day | ORAL | 0 refills | Status: DC
  Filled 2023-02-22: qty 10, 10d supply, fill #0

## 2023-02-23 DIAGNOSIS — M76822 Posterior tibial tendinitis, left leg: Secondary | ICD-10-CM | POA: Diagnosis not present

## 2023-02-24 ENCOUNTER — Other Ambulatory Visit: Payer: Self-pay

## 2023-02-25 ENCOUNTER — Other Ambulatory Visit: Payer: Self-pay

## 2023-03-03 DIAGNOSIS — M76822 Posterior tibial tendinitis, left leg: Secondary | ICD-10-CM | POA: Diagnosis not present

## 2023-03-04 ENCOUNTER — Other Ambulatory Visit: Payer: Self-pay

## 2023-03-08 ENCOUNTER — Other Ambulatory Visit: Payer: Self-pay

## 2023-03-08 DIAGNOSIS — M76822 Posterior tibial tendinitis, left leg: Secondary | ICD-10-CM | POA: Diagnosis not present

## 2023-03-17 DIAGNOSIS — M76822 Posterior tibial tendinitis, left leg: Secondary | ICD-10-CM | POA: Diagnosis not present

## 2023-03-23 DIAGNOSIS — M76822 Posterior tibial tendinitis, left leg: Secondary | ICD-10-CM | POA: Diagnosis not present

## 2023-03-26 ENCOUNTER — Other Ambulatory Visit: Payer: Self-pay

## 2023-03-26 MED ORDER — PHENTERMINE HCL 37.5 MG PO TABS
37.5000 mg | ORAL_TABLET | Freq: Every day | ORAL | 2 refills | Status: AC
  Filled 2023-03-26: qty 30, 30d supply, fill #0
  Filled 2023-04-23: qty 30, 30d supply, fill #1
  Filled 2023-05-25: qty 30, 30d supply, fill #2

## 2023-04-02 ENCOUNTER — Other Ambulatory Visit: Payer: Self-pay | Admitting: Surgery

## 2023-04-10 DIAGNOSIS — G5601 Carpal tunnel syndrome, right upper limb: Secondary | ICD-10-CM

## 2023-04-10 HISTORY — DX: Carpal tunnel syndrome, right upper limb: G56.01

## 2023-04-13 DIAGNOSIS — G5601 Carpal tunnel syndrome, right upper limb: Secondary | ICD-10-CM | POA: Diagnosis not present

## 2023-04-14 ENCOUNTER — Encounter
Admission: RE | Admit: 2023-04-14 | Discharge: 2023-04-14 | Disposition: A | Discharge status: Home / Self Care | Payer: 59 | Source: Ambulatory Visit | Attending: Surgery | Admitting: Surgery

## 2023-04-14 VITALS — Ht 63.0 in | Wt 185.0 lb

## 2023-04-14 DIAGNOSIS — Z01812 Encounter for preprocedural laboratory examination: Secondary | ICD-10-CM

## 2023-04-14 NOTE — Patient Instructions (Addendum)
Your procedure is scheduled on: Thursday, June 13 Report to the Registration Desk on the 1st floor of the [facility] To find out your arrival time, please call [phone] between 1PM - 3PM on: Wednesday, June 12 If your arrival time is 6:00 am, do not arrive before that time as the [facility] entrance doors do not open until 6:00 am.  REMEMBER: Instructions that are not followed completely may result in serious medical risk, up to and including death; or upon the discretion of your surgeon and anesthesiologist your surgery may need to be rescheduled.  Do not eat food after midnight the night before surgery.  No gum chewing or hard candies.  You may however, drink CLEAR liquids up to 2 hours before you are scheduled to arrive for your surgery. Do not drink anything within 2 hours of your scheduled arrival time.  Clear liquids include: - water  - apple juice without pulp - gatorade (not RED colors) - black coffee or tea (Do NOT add milk or creamers to the coffee or tea) Do NOT drink anything that is not on this list.  In addition, your doctor has ordered for you to drink the provided:  Ensure Pre-Surgery Clear Carbohydrate Drink  or Gatorade  Drinking this carbohydrate drink up to two hours before surgery helps to reduce insulin resistance and improve patient outcomes. Please complete drinking 2 hours before scheduled arrival time.  One week prior to surgery: starting June 6 Stop Anti-inflammatories (NSAIDS) such as Advil, Aleve, Ibuprofen, Motrin, Naproxen, Naprosyn and Aspirin based products such as Excedrin, Goody's Powder, BC Powder. Stop ANY OVER THE COUNTER supplements until after surgery. You may however, continue to take Tylenol if needed for pain up until the day of surgery.  Continue taking all prescribed medications with the exception of the following:  Phentermine - hold for 4 days before surgery.  TAKE ONLY THESE MEDICATIONS THE MORNING OF SURGERY WITH A SIP OF  WATER:  Propranolol  No Alcohol for 24 hours before or after surgery.  No Smoking including e-cigarettes for 24 hours before surgery.  No chewable tobacco products for at least 6 hours before surgery.  No nicotine patches on the day of surgery.  Do not use any "recreational" drugs for at least a week (preferably 2 weeks) before your surgery.  Please be advised that the combination of cocaine and anesthesia may have negative outcomes, up to and including death. If you test positive for cocaine, your surgery will be cancelled.  On the morning of surgery brush your teeth with toothpaste and water, you may rinse your mouth with mouthwash if you wish. Do not swallow any toothpaste or mouthwash.  Use CHG Soap as directed on instruction sheet.  Do not wear jewelry, make-up, hairpins, clips or nail polish.  Do not wear lotions, powders, or perfumes.   Do not shave body hair from the neck down 48 hours before surgery.  Contact lenses, hearing aids and dentures may not be worn into surgery.  Do not bring valuables to the hospital. [facility] is not responsible for any missing/lost belongings or valuables.   Notify your doctor if there is any change in your medical condition (cold, fever, infection).  Wear comfortable clothing (specific to your surgery type) to the hospital.  After surgery, you can help prevent lung complications by doing breathing exercises.  Take deep breaths and cough every 1-2 hours. Your doctor may order a device called an Incentive Spirometer to help you take deep breaths.  If you are being discharged the day of surgery, you will not be allowed to drive home. You will need a responsible individual to drive you home and stay with you for 24 hours after surgery.   If you are taking public transportation, you will need to have a responsible individual with you.  Please call the Pre-admissions Testing Dept. at [phone] if you have any questions about these  instructions.  Surgery Visitation Policy:  Patients having surgery or a procedure may have two visitors.  Children under the age of 69 must have an adult with them who is not the patient.     Preparing for Surgery with CHLORHEXIDINE GLUCONATE (CHG) Soap  Chlorhexidine Gluconate (CHG) Soap  o An antiseptic cleaner that kills germs and bonds with the skin to continue killing germs even after washing  o Used for showering the night before surgery and morning of surgery  Before surgery, you can play an important role by reducing the number of germs on your skin.  CHG (Chlorhexidine gluconate) soap is an antiseptic cleanser which kills germs and bonds with the skin to continue killing germs even after washing.  Please do not use if you have an allergy to CHG or antibacterial soaps. If your skin becomes reddened/irritated stop using the CHG.  1. Shower the NIGHT BEFORE SURGERY and the MORNING OF SURGERY with CHG soap.  2. If you choose to wash your hair, wash your hair first as usual with your normal shampoo.  3. After shampooing, rinse your hair and body thoroughly to remove the shampoo.  4. Use CHG as you would any other liquid soap. You can apply CHG directly to the skin and wash gently with a scrungie or a clean washcloth.  5. Apply the CHG soap to your body only from the neck down. Do not use on open wounds or open sores. Avoid contact with your eyes, ears, mouth, and genitals (private parts). Wash face and genitals (private parts) with your normal soap.  6. Wash thoroughly, paying special attention to the area where your surgery will be performed.  7. Thoroughly rinse your body with warm water.  8. Do not shower/wash with your normal soap after using and rinsing off the CHG soap.  9. Pat yourself dry with a clean towel.  10. Wear clean pajamas to bed the night before surgery.  12. Place clean sheets on your bed the night of your first shower and do not sleep with  pets.  13. Shower again with the CHG soap on the day of surgery prior to arriving at the hospital.  14. Do not apply any deodorants/lotions/powders.  15. Please wear clean clothes to the hospital.

## 2023-04-16 ENCOUNTER — Other Ambulatory Visit: Payer: Self-pay

## 2023-04-22 ENCOUNTER — Ambulatory Visit
Admission: RE | Admit: 2023-04-22 | Discharge: 2023-04-22 | Disposition: A | Discharge status: Home / Self Care | Payer: 59 | Attending: Surgery | Admitting: Surgery

## 2023-04-22 ENCOUNTER — Ambulatory Visit: Payer: 59 | Admitting: Anesthesiology

## 2023-04-22 ENCOUNTER — Encounter
Admission: RE | Disposition: A | Discharge status: Home / Self Care | Payer: Self-pay | Source: Home / Self Care | Attending: Surgery

## 2023-04-22 ENCOUNTER — Encounter: Payer: Self-pay | Admitting: Surgery

## 2023-04-22 ENCOUNTER — Other Ambulatory Visit: Payer: Self-pay

## 2023-04-22 DIAGNOSIS — G5601 Carpal tunnel syndrome, right upper limb: Secondary | ICD-10-CM | POA: Insufficient documentation

## 2023-04-22 DIAGNOSIS — Z01812 Encounter for preprocedural laboratory examination: Secondary | ICD-10-CM

## 2023-04-22 HISTORY — PX: CARPAL TUNNEL RELEASE: SHX101

## 2023-04-22 LAB — POCT PREGNANCY, URINE: Preg Test, Ur: NEGATIVE

## 2023-04-22 SURGERY — RELEASE, CARPAL TUNNEL, ENDOSCOPIC
Anesthesia: General | Site: Wrist | Laterality: Right

## 2023-04-22 MED ORDER — PROPOFOL 10 MG/ML IV BOLUS
INTRAVENOUS | Status: AC
  Filled 2023-04-22: qty 20

## 2023-04-22 MED ORDER — METOCLOPRAMIDE HCL 10 MG PO TABS: 5.0000 mg | ORAL_TABLET | Freq: Three times a day (TID) | ORAL | Status: DC | PRN

## 2023-04-22 MED ORDER — ONDANSETRON HCL 4 MG/2ML IJ SOLN
INTRAMUSCULAR | Status: AC
  Filled 2023-04-22: qty 2

## 2023-04-22 MED ORDER — ONDANSETRON HCL 4 MG PO TABS: 4.0000 mg | ORAL_TABLET | Freq: Four times a day (QID) | ORAL | Status: DC | PRN

## 2023-04-22 MED ORDER — 0.9 % SODIUM CHLORIDE (POUR BTL) OPTIME
TOPICAL | Status: DC | PRN
  Administered 2023-04-22: 500 mL

## 2023-04-22 MED ORDER — OXYCODONE HCL 5 MG/5ML PO SOLN: 5.0000 mg | Freq: Once | ORAL | Status: DC | PRN

## 2023-04-22 MED ORDER — MIDAZOLAM HCL 2 MG/2ML IJ SOLN
INTRAMUSCULAR | Status: DC | PRN
  Administered 2023-04-22: 2 mg via INTRAVENOUS

## 2023-04-22 MED ORDER — FENTANYL CITRATE (PF) 100 MCG/2ML IJ SOLN: 25.0000 ug | INTRAMUSCULAR | Status: DC | PRN

## 2023-04-22 MED ORDER — ACETAMINOPHEN 10 MG/ML IV SOLN
INTRAVENOUS | Status: DC | PRN
  Administered 2023-04-22: 1000 mg via INTRAVENOUS

## 2023-04-22 MED ORDER — VANCOMYCIN HCL IN DEXTROSE 1-5 GM/200ML-% IV SOLN
1000.0000 mg | INTRAVENOUS | Status: AC
  Administered 2023-04-22: 1000 mg via INTRAVENOUS

## 2023-04-22 MED ORDER — FAMOTIDINE 20 MG PO TABS
20.0000 mg | ORAL_TABLET | Freq: Once | ORAL | Status: AC
  Administered 2023-04-22: 20 mg via ORAL

## 2023-04-22 MED ORDER — CHLORHEXIDINE GLUCONATE 0.12 % MT SOLN
15.0000 mL | Freq: Once | OROMUCOSAL | Status: AC
  Administered 2023-04-22: 15 mL via OROMUCOSAL

## 2023-04-22 MED ORDER — BUPIVACAINE HCL (PF) 0.5 % IJ SOLN
INTRAMUSCULAR | Status: AC
  Filled 2023-04-22: qty 30

## 2023-04-22 MED ORDER — KETOROLAC TROMETHAMINE 30 MG/ML IJ SOLN
INTRAMUSCULAR | Status: AC
  Filled 2023-04-22: qty 1

## 2023-04-22 MED ORDER — ORAL CARE MOUTH RINSE: 15.0000 mL | Freq: Once | OROMUCOSAL | Status: AC

## 2023-04-22 MED ORDER — FENTANYL CITRATE (PF) 100 MCG/2ML IJ SOLN
INTRAMUSCULAR | Status: DC | PRN
  Administered 2023-04-22 (×2): 25 ug via INTRAVENOUS
  Administered 2023-04-22: 50 ug via INTRAVENOUS

## 2023-04-22 MED ORDER — DEXAMETHASONE SODIUM PHOSPHATE 10 MG/ML IJ SOLN
INTRAMUSCULAR | Status: AC
  Filled 2023-04-22: qty 1

## 2023-04-22 MED ORDER — VANCOMYCIN HCL IN DEXTROSE 1-5 GM/200ML-% IV SOLN
INTRAVENOUS | Status: AC
  Filled 2023-04-22: qty 200

## 2023-04-22 MED ORDER — BUPIVACAINE HCL (PF) 0.5 % IJ SOLN
INTRAMUSCULAR | Status: DC | PRN
  Administered 2023-04-22: 10 mL

## 2023-04-22 MED ORDER — POTASSIUM CHLORIDE IN NACL 20-0.9 MEQ/L-% IV SOLN
INTRAVENOUS | Status: DC
  Filled 2023-04-22 (×2): qty 1000

## 2023-04-22 MED ORDER — HYDROCODONE-ACETAMINOPHEN 5-325 MG PO TABS: 1.0000 | ORAL_TABLET | ORAL | Status: DC | PRN

## 2023-04-22 MED ORDER — OXYCODONE HCL 5 MG PO TABS: 5.0000 mg | ORAL_TABLET | Freq: Once | ORAL | Status: DC | PRN

## 2023-04-22 MED ORDER — MIDAZOLAM HCL 2 MG/2ML IJ SOLN
INTRAMUSCULAR | Status: AC
  Filled 2023-04-22: qty 2

## 2023-04-22 MED ORDER — KETOROLAC TROMETHAMINE 30 MG/ML IJ SOLN
INTRAMUSCULAR | Status: DC | PRN
  Administered 2023-04-22: 30 mg via INTRAVENOUS

## 2023-04-22 MED ORDER — FENTANYL CITRATE (PF) 100 MCG/2ML IJ SOLN
INTRAMUSCULAR | Status: AC
  Filled 2023-04-22: qty 2

## 2023-04-22 MED ORDER — ONDANSETRON HCL 4 MG/2ML IJ SOLN: 4.0000 mg | Freq: Four times a day (QID) | INTRAMUSCULAR | Status: DC | PRN

## 2023-04-22 MED ORDER — KETOROLAC TROMETHAMINE 30 MG/ML IJ SOLN: 30.0000 mg | Freq: Once | INTRAMUSCULAR | Status: DC

## 2023-04-22 MED ORDER — FAMOTIDINE 20 MG PO TABS
ORAL_TABLET | ORAL | Status: AC
  Filled 2023-04-22: qty 1

## 2023-04-22 MED ORDER — LACTATED RINGERS IV SOLN: INTRAVENOUS | Status: DC

## 2023-04-22 MED ORDER — PROPOFOL 10 MG/ML IV BOLUS
INTRAVENOUS | Status: DC | PRN
  Administered 2023-04-22: 170 mg via INTRAVENOUS

## 2023-04-22 MED ORDER — CHLORHEXIDINE GLUCONATE 0.12 % MT SOLN
OROMUCOSAL | Status: AC
  Filled 2023-04-22: qty 15

## 2023-04-22 MED ORDER — ONDANSETRON HCL 4 MG/2ML IJ SOLN
INTRAMUSCULAR | Status: DC | PRN
  Administered 2023-04-22: 4 mg via INTRAVENOUS

## 2023-04-22 MED ORDER — ACETAMINOPHEN 325 MG PO TABS: 325.0000 mg | ORAL_TABLET | Freq: Four times a day (QID) | ORAL | Status: DC | PRN

## 2023-04-22 MED ORDER — PROPOFOL 500 MG/50ML IV EMUL
INTRAVENOUS | Status: DC | PRN
  Administered 2023-04-22: 100 ug/kg/min via INTRAVENOUS

## 2023-04-22 MED ORDER — DIPHENHYDRAMINE HCL 50 MG/ML IJ SOLN
INTRAMUSCULAR | Status: DC | PRN
  Administered 2023-04-22: 25 mg via INTRAVENOUS

## 2023-04-22 MED ORDER — DIPHENHYDRAMINE HCL 50 MG/ML IJ SOLN
INTRAMUSCULAR | Status: AC
  Filled 2023-04-22: qty 1

## 2023-04-22 MED ORDER — LIDOCAINE HCL (PF) 2 % IJ SOLN
INTRAMUSCULAR | Status: AC
  Filled 2023-04-22: qty 5

## 2023-04-22 MED ORDER — METOCLOPRAMIDE HCL 5 MG/ML IJ SOLN: 5.0000 mg | Freq: Three times a day (TID) | INTRAMUSCULAR | Status: DC | PRN

## 2023-04-22 MED ORDER — DEXAMETHASONE SODIUM PHOSPHATE 10 MG/ML IJ SOLN
INTRAMUSCULAR | Status: DC | PRN
  Administered 2023-04-22: 10 mg via INTRAVENOUS

## 2023-04-22 MED ORDER — ACETAMINOPHEN 10 MG/ML IV SOLN
INTRAVENOUS | Status: AC
  Filled 2023-04-22: qty 100

## 2023-04-22 SURGICAL SUPPLY — 27 items
APL PRP STRL LF DISP 70% ISPRP (MISCELLANEOUS) ×1
BNDG CMPR 5X2 KNTD ELC UNQ LF (GAUZE/BANDAGES/DRESSINGS) ×1
BNDG CMPR 5X4 CHSV STRCH STRL (GAUZE/BANDAGES/DRESSINGS) ×1
BNDG COHESIVE 4X5 TAN STRL LF (GAUZE/BANDAGES/DRESSINGS) ×2 IMPLANT
BNDG ELASTIC 2INX 5YD STR LF (GAUZE/BANDAGES/DRESSINGS) ×2 IMPLANT
BNDG ESMARCH 4 X 12 STRL LF (GAUZE/BANDAGES/DRESSINGS) ×1
BNDG ESMARCH 4X12 STRL LF (GAUZE/BANDAGES/DRESSINGS) ×2 IMPLANT
CHLORAPREP W/TINT 26 (MISCELLANEOUS) ×2 IMPLANT
CORD BIP STRL DISP 12FT (MISCELLANEOUS) ×2 IMPLANT
FORCEPS JEWEL BIP 4-3/4 STR (INSTRUMENTS) ×2 IMPLANT
GAUZE SPONGE 4X4 12PLY STRL (GAUZE/BANDAGES/DRESSINGS) ×2 IMPLANT
GAUZE XEROFORM 1X8 LF (GAUZE/BANDAGES/DRESSINGS) ×2 IMPLANT
GLOVE BIO SURGEON STRL SZ8 (GLOVE) ×2 IMPLANT
GLOVE INDICATOR 8.0 STRL GRN (GLOVE) ×2 IMPLANT
GOWN STRL REUS W/ TWL LRG LVL3 (GOWN DISPOSABLE) ×2 IMPLANT
GOWN STRL REUS W/ TWL XL LVL3 (GOWN DISPOSABLE) ×2 IMPLANT
GOWN STRL REUS W/TWL LRG LVL3 (GOWN DISPOSABLE) ×1
GOWN STRL REUS W/TWL XL LVL3 (GOWN DISPOSABLE) ×1
KIT CARPAL TUNNEL (MISCELLANEOUS) ×1
KIT ESCP INSRT D SLOT CANN KN (MISCELLANEOUS) ×2 IMPLANT
KIT TURNOVER KIT A (KITS) ×2 IMPLANT
MANIFOLD NEPTUNE II (INSTRUMENTS) ×2 IMPLANT
NS IRRIG 500ML POUR BTL (IV SOLUTION) ×2 IMPLANT
PACK EXTREMITY ARMC (MISCELLANEOUS) ×2 IMPLANT
SPLINT WRIST LG RT TX900304 (SOFTGOODS) IMPLANT
STOCKINETTE IMPERVIOUS 9X36 MD (GAUZE/BANDAGES/DRESSINGS) ×2 IMPLANT
SUT PROLENE 4 0 PS 2 18 (SUTURE) ×2 IMPLANT

## 2023-04-22 NOTE — Anesthesia Postprocedure Evaluation (Signed)
Anesthesia Post Note  Patient: Lindsay Parsons  Procedure(s) Performed: CARPAL TUNNEL RELEASE ENDOSCOPIC (Right: Wrist)  Patient location during evaluation: PACU Anesthesia Type: General Level of consciousness: awake and alert Pain management: pain level controlled Vital Signs Assessment: post-procedure vital signs reviewed and stable Respiratory status: spontaneous breathing, nonlabored ventilation, respiratory function stable and patient connected to nasal cannula oxygen Cardiovascular status: blood pressure returned to baseline and stable Postop Assessment: no apparent nausea or vomiting Anesthetic complications: no   No notable events documented.   Last Vitals:  Vitals:   04/22/23 1200 04/22/23 1215  BP: 98/72 104/61  Pulse: 65 62  Resp: 16 16  Temp:  (!) 36.4 C  SpO2: 95% 97%    Last Pain:  Vitals:   04/22/23 1215  TempSrc:   PainSc: 0-No pain                 Louie Boston

## 2023-04-22 NOTE — Anesthesia Procedure Notes (Signed)
Procedure Name: LMA Insertion Date/Time: 04/22/2023 10:30 AM  Performed by: Omer Jack, CRNAPre-anesthesia Checklist: Patient identified, Patient being monitored, Timeout performed, Emergency Drugs available and Suction available Patient Re-evaluated:Patient Re-evaluated prior to induction Oxygen Delivery Method: Circle system utilized Preoxygenation: Pre-oxygenation with 100% oxygen Induction Type: IV induction Ventilation: Mask ventilation without difficulty LMA: LMA inserted LMA Size: 4.0 Tube type: Oral Number of attempts: 1 Placement Confirmation: positive ETCO2 and breath sounds checked- equal and bilateral Tube secured with: Tape Dental Injury: Teeth and Oropharynx as per pre-operative assessment

## 2023-04-22 NOTE — H&P (Signed)
History of Present Illness: Lindsay Parsons is a 37 y.o. female who presents today for history and physical for right carpal tunnel endoscopic release with Dr. Joice Lofts on 04/22/2023. She has had greater than 4 years of symptoms of right carpal tunnel syndrome. She had nerve conduction studies 3 years ago showing moderate grade 2 carpal tunnel syndrome. She has had numerous carpal tunnel injections giving her relief but relief always short-lived. Despite bracing she continues to have numerous nights of nighttime awakening with numbness and tingling throughout the right hand median nerve distribution. No significant weakness. She has seen Dr. Joice Lofts and discussed endoscopic carpal tunnel release and agreed and consented to the procedure  Past Medical History: Anxiety  Cervical disc disease  Hepatic adenoma  History of chicken pox  Hypertension  Liver mass 06/25/2021  Neck pain  Pap smear for cervical cancer screening (no hx abnormal paps, 02/2014 pap hpv negative)  SVT (supraventricular tachycardia)   Past Surgical History: TONSILLECTOMY 1989  EXCISION FOOT LESION CYST/GANGLION 2007  extracorporeal shock wave lithotripsy Left 08/25/2018  excision back mass 12/16/2021  ADENOIDECTOMY   Past Family History: Diabetes type II Mother  Celiac disease Mother  Hyperlipidemia (Elevated cholesterol) Father  Skin cancer Father  Cancer Father  No Known Problems Daughter  No Known Problems Son  Other Paternal Aunt  bells palsy  Diabetes type II Maternal Grandmother  Cancer Maternal Grandmother  Skin cancer Maternal Grandmother  Cancer Maternal Grandfather  Skin cancer Paternal Grandmother  Lung cancer Paternal Grandmother  Leukemia Paternal Grandmother  Glaucoma Paternal Grandmother  Cancer Paternal Grandfather  Skin cancer Paternal Grandfather  High blood pressure (Hypertension) Paternal Grandfather  Breast cancer Neg Hx  Colon cancer Neg Hx   Medications: cyanocobalamin (VITAMIN B12)  1,000 mcg/mL injection Inject 1 mL (1,000 mcg total) into the muscle monthly 1 mL 11  ondansetron (ZOFRAN-ODT) 8 MG disintegrating tablet Take 1 tablet (8 mg total) by mouth every 8 (eight) hours as needed for up to 360 days 20 tablet 11  phentermine (ADIPEX-P) 37.5 mg tablet Take 1 tablet (37.5 mg total) by mouth every morning before breakfast 30 tablet 2  phentermine-topiramate (QSYMIA) 7.5-46 mg ER capsule Take 1 capsule by mouth every morning for 120 days 30 capsule 3  propranoloL (INDERAL) 20 MG tablet Take 1.5 tablets (30 mg total) by mouth 2 (two) times daily for 360 days 270 tablet 3  rizatriptan (MAXALT-MLT) 10 MG disintegrating tablet Take 1 tablet (10 mg total) by mouth as directed for up to 360 doses 6 tablet 11  semaglutide (RYBELSUS) 3 mg tablet Take 1 tablet (3 mg total) by mouth once daily Do not cut, crush, or chew 90 tablet 0   Allergies: Nylon Hives (hives/petechiae that resolved after nylon sutures were removed)  Cephalosporins Hives and Itching   Review of Systems:  A comprehensive 14 point ROS was performed, reviewed by me today, and the pertinent orthopaedic findings are documented in the HPI.  Physical Exam: BP 138/82  Ht 160 cm (5\' 3" )  Wt 83.6 kg (184 lb 3.2 oz)  BMI 32.63 kg/m  General:  Well developed, well nourished, no apparent distress, normal affect, normal gait with no antalgic component.   HEENT: Head normocephalic, atraumatic, PERRL.   Abdomen: Soft, non tender, non distended, Bowel sounds present.  Heart: Examination of the heart reveals regular, rate, and rhythm. There is no murmur noted on ascultation. There is a normal apical pulse.  Lungs: Lungs are clear to auscultation. There is no wheeze, rhonchi, or  crackles. There is normal expansion of bilateral chest walls.   Right Upper Extremity: Normal shoulder contour. Good active and passive range of motion and stability of the shoulder, elbow, and wrist. Normal motion of the hand and digits. No  swelling, erythema, or ecchymosis is noted. There is no triggering or locking of the digits noted. No thenar atrophy is visualized. No intrinsic wasting. The patient has a positive Phalen's test. The patient has a positive Tinel's test. The patient has decreased pinprick and light touch sensation in the median nerve distribution. There is normal grip strength and pincer strength. The patient has less than 2 second capillary refill with good skin warmth. Normal radial and ulnar pulse is palpated. Slight thenar atrophy  Neurologic: Sensory function is intact, except as noted above. Motor strength is 5/5, except as noted above. No tremor or clonus is present. Good motor coordination is noted.   Impression: Carpal tunnel syndrome of right wrist.  Plan:  37 year old female with advanced right carpal tunnel syndrome. She has had years of carpal tunnel symptoms with progression. She has had past EMG nerve conduction study showing moderate grade 2 carpal tunnel syndrome couple years ago. Despite injections, bracing she continues to have nighttime awakening and severe symptoms. Patient agreed and consented to a right endoscopic carpal tunnel release with Dr. Joice Lofts.    H&P reviewed and patient re-examined. No changes.

## 2023-04-22 NOTE — Op Note (Signed)
04/22/2023  11:21 AM  Patient:   Lindsay Parsons  Pre-Op Diagnosis:   Right carpal tunnel syndrome.  Post-Op Diagnosis:   Same.  Procedure:   Endoscopic right carpal tunnel release.  Surgeon:   Maryagnes Amos, MD  Anesthesia:   General LMA  Findings:   As above.  Complications:   None  EBL:   0 cc  Fluids:   300 cc crystalloid  TT:   16 minutes at 250 mmHg  Drains:   None  Closure:   4-0 Prolene interrupted sutures  Brief Clinical Note:   The patient is a 37 year old female with a long history of gradually worsening pain and paresthesias to her right hand. Her symptoms have progressed despite medications, activity modification, etc. Her history and examination are consistent with carpal tunnel syndrome. The patient presents at this time for an endoscopic right carpal tunnel release.   Procedure:   The patient was brought into the operating room and lain in the supine position. After adequate general laryngeal mask anesthesia was obtained, the right hand and upper extremity were prepped with ChloraPrep solution before being draped sterilely. Preoperative antibiotics were administered. A timeout was performed to verify the appropriate surgical site before the limb was exsanguinated with an Esmarch and the tourniquet inflated to 250 mmHg.   An approximately 1.5-2 cm incision was made over the volar wrist flexion crease, centered over the palmaris longus tendon. The incision was carried down through the subcutaneous tissues with care taken to identify and protect any neurovascular structures. The distal forearm fascia was penetrated just proximal to the transverse carpal ligament. The soft tissues were released off the superficial and deep surfaces of the distal forearm fascia and this was released proximally for 3-4 cm under direct visualization.  Attention was directed distally. The Therapist, nutritional was passed beneath the transverse carpal ligament along the ulnar aspect of the  carpal tunnel and used to release any adhesions as well as to remove any adherent synovial tissue before first the smaller then the larger of the two dilators were passed beneath the transverse carpal ligament along the ulnar margin of the carpal tunnel. The slotted cannula was introduced and the endoscope was placed into the slotted cannula and the undersurface of the transverse carpal ligament visualized. The distal margin of the transverse carpal ligament was marked by placing a 25-gauge needle percutaneously at Kaplan's cardinal point so that it entered the distal portion of the slotted cannula. Under endoscopic visualization, the transverse carpal ligament was released from proximal to distal using the end-cutting blade. A second pass was performed to ensure complete release of the ligament. The adequacy of release was verified both endoscopically and by palpation using the freer elevator.  The wound was irrigated thoroughly with sterile saline solution before being closed using 4-0 Prolene interrupted sutures. A total of 10 cc of 0.5% plain Sensorcaine was injected in and around the incision before a sterile bulky dressing was applied to the wound. The patient was placed into a volar wrist splint before being awakened, extubated, and returned to the recovery room in satisfactory condition after tolerating the procedure well.

## 2023-04-22 NOTE — Discharge Instructions (Addendum)
Orthopedic discharge instructions: Keep dressing dry and intact. Keep hand elevated above heart level. May shower after dressing removed on postop day 4 (Monday). Cover sutures with Band-Aids after drying off. Apply ice to affected area frequently. Take ibuprofen 600-800 mg TID with meals for 5-7 days, then as necessary. Take ES Tylenol or pain medication as prescribed when needed.  Return for follow-up in 10-14 days or as scheduled.  AMBULATORY SURGERY  DISCHARGE INSTRUCTIONS   The drugs that you were given will stay in your system until tomorrow so for the next 24 hours you should not:  Drive an automobile Make any legal decisions Drink any alcoholic beverage   You may resume regular meals tomorrow.  Today it is better to start with liquids and gradually work up to solid foods.  You may eat anything you prefer, but it is better to start with liquids, then soup and crackers, and gradually work up to solid foods.   Please notify your doctor immediately if you have any unusual bleeding, trouble breathing, redness and pain at the surgery site, drainage, fever, or pain not relieved by medication.    Additional Instructions:    Please contact your physician with any problems or Same Day Surgery at [phone], Monday through Friday 6 am to 4 pm, or [place] at [facility] number at [phone].

## 2023-04-22 NOTE — Anesthesia Preprocedure Evaluation (Signed)
Anesthesia Evaluation  Patient identified by MRN, date of birth, ID band Patient awake    Reviewed: Allergy & Precautions, NPO status , Patient's Chart, lab work & pertinent test results  History of Anesthesia Complications Negative for: history of anesthetic complications  Airway Mallampati: II  TM Distance: >3 FB Neck ROM: full    Dental no notable dental hx.    Pulmonary neg pulmonary ROS   Pulmonary exam normal        Cardiovascular negative cardio ROS Normal cardiovascular exam     Neuro/Psych  Headaches  Neuromuscular disease  negative psych ROS   GI/Hepatic negative GI ROS, Neg liver ROS,,,  Endo/Other  negative endocrine ROS    Renal/GU      Musculoskeletal   Abdominal   Peds  Hematology negative hematology ROS (+)   Anesthesia Other Findings Past Medical History: No date: Bulging of cervical intervertebral disc 04/2023: Carpal tunnel syndrome on right No date: Hepatic adenoma No date: History of kidney stones No date: Kidney stones No date: Migraines No date: Renal calculi 2012: SVT (supraventricular tachycardia)  Past Surgical History: No date: ADENOIDECTOMY 09/09/2022: CERVICAL DISC ARTHROPLASTY; N/A     Comment:  Procedure: C6-7 ARTHROPLASTY;  Surgeon: Venetia Night, MD;  Location: [facility] ORS;  Service: Neurosurgery;              Laterality: N/A; No date: excision back mole 08/25/2018: EXTRACORPOREAL SHOCK WAVE LITHOTRIPSY; Left     Comment:  Procedure: EXTRACORPOREAL SHOCK WAVE LITHOTRIPSY (ESWL);              Surgeon: Riki Altes, MD;  Location: [facility] ORS;                Service: Urology;  Laterality: Left; 11/09/2005: FOOT SURGERY     Comment:  cyst removed from left foot 03/26/2022: GASTROC RECESSION EXTREMITY; Left     Comment:  Procedure: GASTROC RECESSION AND PLANTAR FASCIA RELEASE;              Surgeon: Toni Arthurs, MD;  Location: [facility];  Service: Orthopedics;  Laterality: Left; 11/10/1987: TONSILLECTOMY  BMI    Body Mass Index: 32.77 kg/m      Reproductive/Obstetrics negative OB ROS                             Anesthesia Physical Anesthesia Plan  ASA: 2  Anesthesia Plan: General LMA   Post-op Pain Management: Toradol IV (intra-op)* and Ofirmev IV (intra-op)*   Induction: Intravenous  PONV Risk Score and Plan: 3 and Dexamethasone, Ondansetron, Midazolam and Treatment may vary due to age or medical condition  Airway Management Planned: LMA  Additional Equipment:   Intra-op Plan:   Post-operative Plan: Extubation in OR  Informed Consent: I have reviewed the patients History and Physical, chart, labs and discussed the procedure including the risks, benefits and alternatives for the proposed anesthesia with the patient or authorized representative who has indicated his/her understanding and acceptance.     Dental Advisory Given  Plan Discussed with: Anesthesiologist, CRNA and Surgeon  Anesthesia Plan Comments: (Patient consented for risks of anesthesia including but not limited to:  - adverse reactions to medications - damage to eyes, teeth, lips or other oral mucosa - nerve damage due to positioning  -  sore throat or hoarseness - Damage to heart, brain, nerves, lungs, other parts of body or loss of life  Patient voiced understanding.)       Anesthesia Quick Evaluation

## 2023-04-22 NOTE — Transfer of Care (Signed)
Immediate Anesthesia Transfer of Care Note  Patient: Lindsay Parsons  Procedure(s) Performed: CARPAL TUNNEL RELEASE ENDOSCOPIC (Right: Wrist)  Patient Location: PACU  Anesthesia Type:General  Level of Consciousness: drowsy and patient cooperative  Airway & Oxygen Therapy: Patient Spontanous Breathing and Patient connected to nasal cannula oxygen  Post-op Assessment: Report given to RN and Post -op Vital signs reviewed and stable  Post vital signs: Reviewed and stable  Last Vitals:  Vitals Value Taken Time  BP 101/55 04/22/23 1124  Temp 36.3 C 04/22/23 1124  Pulse 62 04/22/23 1128  Resp 19 04/22/23 1128  SpO2 96 % 04/22/23 1128  Vitals shown include unvalidated device data.  Last Pain:  Vitals:   04/22/23 1124  TempSrc:   PainSc: Asleep         Complications: No notable events documented.

## 2023-04-23 ENCOUNTER — Encounter: Payer: Self-pay | Admitting: Surgery

## 2023-04-23 ENCOUNTER — Other Ambulatory Visit: Payer: Self-pay

## 2023-05-21 ENCOUNTER — Other Ambulatory Visit: Payer: Self-pay

## 2023-05-21 DIAGNOSIS — M5412 Radiculopathy, cervical region: Secondary | ICD-10-CM

## 2023-05-21 NOTE — Progress Notes (Signed)
Pt requested to get post op xrays a few days prior to appointment because she is going out of town.

## 2023-05-25 ENCOUNTER — Other Ambulatory Visit: Payer: Self-pay

## 2023-05-25 ENCOUNTER — Ambulatory Visit
Admission: RE | Admit: 2023-05-25 | Discharge: 2023-05-25 | Disposition: A | Discharge status: Home / Self Care | Payer: 59 | Source: Ambulatory Visit | Attending: Orthopedic Surgery | Admitting: Orthopedic Surgery

## 2023-05-25 ENCOUNTER — Ambulatory Visit
Admission: RE | Admit: 2023-05-25 | Discharge: 2023-05-25 | Disposition: A | Discharge status: Home / Self Care | Payer: 59 | Attending: Orthopedic Surgery | Admitting: Orthopedic Surgery

## 2023-05-25 DIAGNOSIS — M5412 Radiculopathy, cervical region: Secondary | ICD-10-CM | POA: Insufficient documentation

## 2023-05-25 DIAGNOSIS — M5030 Other cervical disc degeneration, unspecified cervical region: Secondary | ICD-10-CM | POA: Diagnosis not present

## 2023-05-25 DIAGNOSIS — Z9889 Other specified postprocedural states: Secondary | ICD-10-CM | POA: Diagnosis not present

## 2023-05-25 DIAGNOSIS — M47812 Spondylosis without myelopathy or radiculopathy, cervical region: Secondary | ICD-10-CM | POA: Diagnosis not present

## 2023-05-27 ENCOUNTER — Other Ambulatory Visit: Payer: Self-pay | Admitting: Neurology

## 2023-05-27 DIAGNOSIS — G9389 Other specified disorders of brain: Secondary | ICD-10-CM

## 2023-05-28 NOTE — Progress Notes (Unsigned)
   REFERRING PHYSICIAN:  Jerl Mina, Md [address] [place],  [place] [address]  DOS: 09/09/22  Cervical Disc Arthroplasty at C6-C7 using a LDR Mobi-C device   HISTORY OF PRESENT ILLNESS: [facility] is more than 6 months status post Cervical Disc Arthroplasty at C6-C7.   Was doing well at her last visit with only some intermittent neck stiffness. She has known right carpal tunnel syndrome and was seeing ortho.         Was having some musculoskeletal pain at her last visit. He recommended exercises and prednisone. She was released to return to work.   The sharp pain she was having at her last visit is gone. She notes some intermittent stiffness in her neck. No arm pain. She has numbness/tingling in her right hand. She has known carpal tunnel syndrome and is seeing ortho for this. She is doing well being back at work.    PHYSICAL EXAMINATION:  NEUROLOGICAL:  General: In no acute distress.   Awake, alert, oriented to person, place, and time.  Pupils equal round and reactive to light.  Facial tone is symmetric.    Strength: Side Biceps Triceps Deltoid Interossei Grip Wrist Ext. Wrist Flex.  R 5 5 5 5 5 5 5   L 5 5 5 5 5 5 5    Incision well healed.   Imaging:  Cervical spine xrays dated 05/25/23:  *** no complications noted  I have personally reviewed the images and agree with the above interpretation.***   Assessment / Plan: Lindsay Parsons is doing well s/p above surgery. Treatment options reviewed with patient and following plan made:   - She can increase lifting as tolerated.  - Continue current medications including prn motrin.  - Follow up with ortho for carpal tunnel on right.  - Okay to resume running slowly. Reviewed with Dr. Myer Haff after her visit. Message sent to her.  - Follow up in 6 months and prn. Will need cervical xrays prior to that visit. If doing well then can be released to f/u prn.   Advised to contact the  office if any questions or concerns arise.   Drake Leach PA-C Dept of Neurosurgery

## 2023-05-31 ENCOUNTER — Other Ambulatory Visit: Payer: Self-pay

## 2023-05-31 ENCOUNTER — Encounter: Payer: Self-pay | Admitting: Orthopedic Surgery

## 2023-05-31 ENCOUNTER — Ambulatory Visit: Payer: 59 | Admitting: Orthopedic Surgery

## 2023-05-31 VITALS — BP 138/84 | Wt 182.6 lb

## 2023-05-31 DIAGNOSIS — M542 Cervicalgia: Secondary | ICD-10-CM

## 2023-05-31 DIAGNOSIS — Z9889 Other specified postprocedural states: Secondary | ICD-10-CM | POA: Diagnosis not present

## 2023-05-31 MED ORDER — METHYLPREDNISOLONE 4 MG PO TBPK
ORAL_TABLET | ORAL | 0 refills | Status: AC
Start: 2023-05-31 — End: ?
  Filled 2023-05-31: qty 21, 6d supply, fill #0

## 2023-06-17 ENCOUNTER — Other Ambulatory Visit: Payer: Self-pay

## 2023-06-17 DIAGNOSIS — M79672 Pain in left foot: Secondary | ICD-10-CM | POA: Diagnosis not present

## 2023-06-17 DIAGNOSIS — M76822 Posterior tibial tendinitis, left leg: Secondary | ICD-10-CM | POA: Diagnosis not present

## 2023-06-17 MED ORDER — DICLOFENAC SODIUM 1 % EX GEL
2.0000 g | Freq: Three times a day (TID) | CUTANEOUS | 0 refills | Status: AC
  Filled 2023-06-17: qty 100, 17d supply, fill #0

## 2023-07-02 ENCOUNTER — Other Ambulatory Visit: Payer: Self-pay | Admitting: Student

## 2023-07-02 DIAGNOSIS — M79672 Pain in left foot: Secondary | ICD-10-CM

## 2023-07-07 ENCOUNTER — Ambulatory Visit
Admission: RE | Admit: 2023-07-07 | Discharge: 2023-07-07 | Disposition: A | Discharge status: Home / Self Care | Payer: 59 | Source: Ambulatory Visit | Attending: Student | Admitting: Student

## 2023-07-07 ENCOUNTER — Ambulatory Visit
Admission: RE | Admit: 2023-07-07 | Discharge: 2023-07-07 | Disposition: A | Discharge status: Home / Self Care | Payer: 59 | Source: Ambulatory Visit | Attending: Neurology | Admitting: Neurology

## 2023-07-07 DIAGNOSIS — G9389 Other specified disorders of brain: Secondary | ICD-10-CM | POA: Insufficient documentation

## 2023-07-07 DIAGNOSIS — G939 Disorder of brain, unspecified: Secondary | ICD-10-CM | POA: Diagnosis not present

## 2023-07-07 DIAGNOSIS — R600 Localized edema: Secondary | ICD-10-CM | POA: Diagnosis not present

## 2023-07-07 DIAGNOSIS — M79672 Pain in left foot: Secondary | ICD-10-CM | POA: Insufficient documentation

## 2023-07-07 DIAGNOSIS — R930 Abnormal findings on diagnostic imaging of skull and head, not elsewhere classified: Secondary | ICD-10-CM | POA: Diagnosis not present

## 2023-07-07 DIAGNOSIS — M25572 Pain in left ankle and joints of left foot: Secondary | ICD-10-CM | POA: Diagnosis not present

## 2023-07-07 MED ORDER — GADOBUTROL 1 MMOL/ML IV SOLN
8.0000 mL | Freq: Once | INTRAVENOUS | Status: AC | PRN
  Administered 2023-07-07: 8 mL via INTRAVENOUS

## 2023-07-21 DIAGNOSIS — G521 Disorders of glossopharyngeal nerve: Secondary | ICD-10-CM | POA: Diagnosis not present

## 2023-07-21 DIAGNOSIS — G8929 Other chronic pain: Secondary | ICD-10-CM | POA: Diagnosis not present

## 2023-07-21 DIAGNOSIS — R519 Headache, unspecified: Secondary | ICD-10-CM | POA: Diagnosis not present

## 2023-07-26 DIAGNOSIS — M84375A Stress fracture, left foot, initial encounter for fracture: Secondary | ICD-10-CM | POA: Diagnosis not present

## 2023-08-03 ENCOUNTER — Other Ambulatory Visit: Payer: Self-pay

## 2023-08-03 MED ORDER — PROPRANOLOL HCL 20 MG PO TABS
30.0000 mg | ORAL_TABLET | Freq: Two times a day (BID) | ORAL | 3 refills | Status: DC
  Filled 2023-08-03 – 2023-08-16 (×2): qty 270, 90d supply, fill #0
  Filled 2023-12-30: qty 90, 30d supply, fill #1

## 2023-08-03 MED ORDER — RIZATRIPTAN BENZOATE 10 MG PO TBDP
10.0000 mg | ORAL_TABLET | ORAL | 11 refills | Status: AC
  Filled 2023-08-03: qty 6, 15d supply, fill #0
  Filled 2023-09-20: qty 6, 15d supply, fill #1

## 2023-08-04 ENCOUNTER — Other Ambulatory Visit: Payer: Self-pay | Admitting: Family Medicine

## 2023-08-04 ENCOUNTER — Other Ambulatory Visit: Payer: Self-pay

## 2023-08-04 DIAGNOSIS — M4802 Spinal stenosis, cervical region: Secondary | ICD-10-CM | POA: Diagnosis not present

## 2023-08-04 DIAGNOSIS — M542 Cervicalgia: Secondary | ICD-10-CM | POA: Diagnosis not present

## 2023-08-04 DIAGNOSIS — M5412 Radiculopathy, cervical region: Secondary | ICD-10-CM | POA: Diagnosis not present

## 2023-08-04 DIAGNOSIS — M5020 Other cervical disc displacement, unspecified cervical region: Secondary | ICD-10-CM | POA: Diagnosis not present

## 2023-08-04 DIAGNOSIS — M5030 Other cervical disc degeneration, unspecified cervical region: Secondary | ICD-10-CM

## 2023-08-04 MED ORDER — GABAPENTIN 100 MG PO CAPS
100.0000 mg | ORAL_CAPSULE | Freq: Every evening | ORAL | 1 refills | Status: AC
  Filled 2023-08-04: qty 90, 30d supply, fill #0
  Filled 2023-09-20: qty 90, 30d supply, fill #1

## 2023-08-05 ENCOUNTER — Other Ambulatory Visit: Payer: Self-pay | Admitting: Family Medicine

## 2023-08-05 DIAGNOSIS — R160 Hepatomegaly, not elsewhere classified: Secondary | ICD-10-CM

## 2023-08-06 ENCOUNTER — Other Ambulatory Visit: Payer: Self-pay

## 2023-08-06 ENCOUNTER — Ambulatory Visit
Admission: RE | Admit: 2023-08-06 | Discharge: 2023-08-06 | Disposition: A | Discharge status: Home / Self Care | Payer: 59 | Source: Ambulatory Visit | Attending: Family Medicine | Admitting: Family Medicine

## 2023-08-06 DIAGNOSIS — M4802 Spinal stenosis, cervical region: Secondary | ICD-10-CM | POA: Insufficient documentation

## 2023-08-06 DIAGNOSIS — M5030 Other cervical disc degeneration, unspecified cervical region: Secondary | ICD-10-CM | POA: Insufficient documentation

## 2023-08-06 DIAGNOSIS — M5412 Radiculopathy, cervical region: Secondary | ICD-10-CM | POA: Insufficient documentation

## 2023-08-06 DIAGNOSIS — R160 Hepatomegaly, not elsewhere classified: Secondary | ICD-10-CM | POA: Diagnosis not present

## 2023-08-06 MED ORDER — ONDANSETRON 8 MG PO TBDP
8.0000 mg | ORAL_TABLET | Freq: Three times a day (TID) | ORAL | 3 refills | Status: AC | PRN
  Filled 2023-08-06: qty 20, 7d supply, fill #0
  Filled 2023-09-20: qty 20, 7d supply, fill #1
  Filled 2023-10-21: qty 20, 7d supply, fill #2
  Filled 2023-12-30: qty 20, 7d supply, fill #3

## 2023-08-07 ENCOUNTER — Ambulatory Visit
Admission: RE | Admit: 2023-08-07 | Discharge: 2023-08-07 | Disposition: A | Discharge status: Home / Self Care | Payer: 59 | Source: Ambulatory Visit | Attending: Family Medicine | Admitting: Family Medicine

## 2023-08-07 DIAGNOSIS — R160 Hepatomegaly, not elsewhere classified: Secondary | ICD-10-CM | POA: Diagnosis not present

## 2023-08-07 DIAGNOSIS — M5412 Radiculopathy, cervical region: Secondary | ICD-10-CM

## 2023-08-07 DIAGNOSIS — M5030 Other cervical disc degeneration, unspecified cervical region: Secondary | ICD-10-CM

## 2023-08-07 DIAGNOSIS — M50222 Other cervical disc displacement at C5-C6 level: Secondary | ICD-10-CM | POA: Diagnosis not present

## 2023-08-07 DIAGNOSIS — M4802 Spinal stenosis, cervical region: Secondary | ICD-10-CM

## 2023-08-07 DIAGNOSIS — Q048 Other specified congenital malformations of brain: Secondary | ICD-10-CM | POA: Diagnosis not present

## 2023-08-16 ENCOUNTER — Other Ambulatory Visit: Payer: Self-pay

## 2023-08-17 ENCOUNTER — Other Ambulatory Visit: Payer: Self-pay

## 2023-09-08 ENCOUNTER — Other Ambulatory Visit: Payer: Self-pay

## 2023-09-08 MED ORDER — METFORMIN HCL 500 MG PO TABS
ORAL_TABLET | ORAL | 5 refills | Status: DC
  Filled 2023-09-08: qty 60, 30d supply, fill #0
  Filled 2023-10-03: qty 60, 30d supply, fill #1
  Filled 2023-11-03: qty 60, 30d supply, fill #2
  Filled 2023-12-02: qty 60, 30d supply, fill #3
  Filled 2023-12-30: qty 60, 30d supply, fill #4

## 2023-09-20 DIAGNOSIS — Z01419 Encounter for gynecological examination (general) (routine) without abnormal findings: Secondary | ICD-10-CM | POA: Diagnosis not present

## 2023-09-20 DIAGNOSIS — Z6833 Body mass index (BMI) 33.0-33.9, adult: Secondary | ICD-10-CM | POA: Diagnosis not present

## 2023-09-29 DIAGNOSIS — M84375A Stress fracture, left foot, initial encounter for fracture: Secondary | ICD-10-CM | POA: Diagnosis not present

## 2023-10-12 DIAGNOSIS — R058 Other specified cough: Secondary | ICD-10-CM | POA: Diagnosis not present

## 2023-10-12 DIAGNOSIS — J040 Acute laryngitis: Secondary | ICD-10-CM | POA: Diagnosis not present

## 2023-10-12 DIAGNOSIS — J029 Acute pharyngitis, unspecified: Secondary | ICD-10-CM | POA: Diagnosis not present

## 2023-10-12 DIAGNOSIS — H9203 Otalgia, bilateral: Secondary | ICD-10-CM | POA: Diagnosis not present

## 2023-10-19 DIAGNOSIS — Z3202 Encounter for pregnancy test, result negative: Secondary | ICD-10-CM | POA: Diagnosis not present

## 2023-10-19 DIAGNOSIS — Z30430 Encounter for insertion of intrauterine contraceptive device: Secondary | ICD-10-CM | POA: Diagnosis not present

## 2023-10-21 ENCOUNTER — Other Ambulatory Visit: Payer: Self-pay

## 2023-10-21 MED ORDER — DOXYCYCLINE HYCLATE 100 MG PO CAPS
100.0000 mg | ORAL_CAPSULE | Freq: Two times a day (BID) | ORAL | 0 refills | Status: AC
  Filled 2023-10-21: qty 20, 10d supply, fill #0

## 2023-10-22 ENCOUNTER — Other Ambulatory Visit: Payer: Self-pay

## 2023-11-04 ENCOUNTER — Other Ambulatory Visit (HOSPITAL_COMMUNITY): Payer: Self-pay

## 2023-11-04 ENCOUNTER — Other Ambulatory Visit: Payer: Self-pay

## 2023-11-17 ENCOUNTER — Other Ambulatory Visit: Payer: Self-pay

## 2023-11-17 MED ORDER — DOXYCYCLINE MONOHYDRATE 100 MG PO CAPS
100.0000 mg | ORAL_CAPSULE | Freq: Two times a day (BID) | ORAL | 0 refills | Status: DC
  Filled 2023-11-17: qty 20, 10d supply, fill #0

## 2023-12-02 ENCOUNTER — Other Ambulatory Visit (HOSPITAL_COMMUNITY): Payer: Self-pay

## 2023-12-30 ENCOUNTER — Other Ambulatory Visit: Payer: Self-pay

## 2024-01-20 ENCOUNTER — Other Ambulatory Visit: Payer: Self-pay

## 2024-01-20 MED ORDER — PHENTERMINE HCL 37.5 MG PO TABS
37.5000 mg | ORAL_TABLET | Freq: Every day | ORAL | 0 refills | Status: DC
  Filled 2024-01-20: qty 30, 30d supply, fill #0

## 2024-01-20 MED ORDER — HYDROCODONE BIT-HOMATROP MBR 5-1.5 MG/5ML PO SOLN
ORAL | 0 refills | Status: DC
  Filled 2024-01-20: qty 120, 6d supply, fill #0

## 2024-02-07 ENCOUNTER — Other Ambulatory Visit: Payer: Self-pay

## 2024-02-08 ENCOUNTER — Other Ambulatory Visit: Payer: Self-pay

## 2024-02-08 MED ORDER — PHENTERMINE HCL 37.5 MG PO TABS
37.5000 mg | ORAL_TABLET | Freq: Every morning | ORAL | 2 refills | Status: DC
Start: 2024-02-07 — End: 2024-05-01
  Filled 2024-02-11 – 2024-02-17 (×2): qty 30, 30d supply, fill #0
  Filled 2024-03-20: qty 30, 30d supply, fill #1
  Filled 2024-04-20: qty 30, 30d supply, fill #2

## 2024-02-11 ENCOUNTER — Other Ambulatory Visit: Payer: Self-pay

## 2024-02-17 ENCOUNTER — Other Ambulatory Visit: Payer: Self-pay

## 2024-03-20 ENCOUNTER — Other Ambulatory Visit: Payer: Self-pay

## 2024-04-20 ENCOUNTER — Other Ambulatory Visit: Payer: Self-pay

## 2024-05-01 ENCOUNTER — Other Ambulatory Visit: Payer: Self-pay

## 2024-05-01 MED ORDER — PHENTERMINE HCL 37.5 MG PO TABS
37.5000 mg | ORAL_TABLET | Freq: Every day | ORAL | 2 refills | Status: AC
  Filled 2024-05-15 – 2024-05-24 (×2): qty 30, 30d supply, fill #0
  Filled 2024-06-26: qty 30, 30d supply, fill #1
  Filled 2024-07-25: qty 30, 30d supply, fill #2

## 2024-05-15 ENCOUNTER — Other Ambulatory Visit: Payer: Self-pay

## 2024-05-25 ENCOUNTER — Other Ambulatory Visit: Payer: Self-pay

## 2024-06-09 ENCOUNTER — Other Ambulatory Visit: Payer: Self-pay

## 2024-06-09 MED ORDER — PREDNISONE 10 MG PO TABS
ORAL_TABLET | ORAL | 0 refills | Status: AC
  Filled 2024-06-09: qty 30, 10d supply, fill #0

## 2024-06-21 ENCOUNTER — Other Ambulatory Visit: Payer: Self-pay

## 2024-06-21 MED ORDER — PROPRANOLOL HCL 20 MG PO TABS
30.0000 mg | ORAL_TABLET | Freq: Two times a day (BID) | ORAL | 11 refills | Status: AC
  Filled 2024-06-21 – 2024-09-13 (×3): qty 90, 30d supply, fill #0
  Filled 2024-11-14: qty 90, 30d supply, fill #1

## 2024-06-21 MED ORDER — RIZATRIPTAN BENZOATE 10 MG PO TBDP
10.0000 mg | ORAL_TABLET | ORAL | 11 refills | Status: AC | PRN
  Filled 2024-06-21: qty 10, 15d supply, fill #0

## 2024-06-21 MED ORDER — ONDANSETRON HCL 4 MG PO TABS
4.0000 mg | ORAL_TABLET | Freq: Three times a day (TID) | ORAL | 0 refills | Status: AC | PRN
  Filled 2024-06-21 (×2): qty 20, 7d supply, fill #0

## 2024-06-26 ENCOUNTER — Other Ambulatory Visit: Payer: Self-pay

## 2024-07-25 ENCOUNTER — Other Ambulatory Visit: Payer: Self-pay

## 2024-09-03 ENCOUNTER — Other Ambulatory Visit: Payer: Self-pay

## 2024-09-03 ENCOUNTER — Ambulatory Visit
Admission: RE | Admit: 2024-09-03 | Discharge: 2024-09-03 | Disposition: A | Discharge status: Home / Self Care | Source: Ambulatory Visit | Attending: Internal Medicine | Admitting: Internal Medicine

## 2024-09-03 DIAGNOSIS — S161XXA Strain of muscle, fascia and tendon at neck level, initial encounter: Secondary | ICD-10-CM

## 2024-09-03 MED ORDER — BACLOFEN 10 MG PO TABS
10.0000 mg | ORAL_TABLET | Freq: Three times a day (TID) | ORAL | 0 refills | Status: AC
  Filled 2024-09-03: qty 30, 10d supply, fill #0
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
## 2024-09-13 ENCOUNTER — Other Ambulatory Visit: Payer: Self-pay

## 2024-09-13 ENCOUNTER — Other Ambulatory Visit (HOSPITAL_COMMUNITY): Payer: Self-pay

## 2024-10-12 ENCOUNTER — Other Ambulatory Visit: Payer: Self-pay

## 2024-10-12 MED ORDER — AMOXICILLIN-POT CLAVULANATE 875-125 MG PO TABS
1.0000 | ORAL_TABLET | Freq: Two times a day (BID) | ORAL | 0 refills | Status: AC
  Filled 2024-10-12: qty 10, 5d supply, fill #0

## 2024-10-12 MED ORDER — PREDNISONE 20 MG PO TABS
20.0000 mg | ORAL_TABLET | Freq: Every day | ORAL | 0 refills | Status: AC
  Filled 2024-10-12: qty 5, 5d supply, fill #0

## 2024-11-15 ENCOUNTER — Other Ambulatory Visit: Payer: Self-pay | Admitting: Family Medicine

## 2024-11-15 DIAGNOSIS — Z1231 Encounter for screening mammogram for malignant neoplasm of breast: Secondary | ICD-10-CM

## 2024-11-16 ENCOUNTER — Other Ambulatory Visit: Payer: Self-pay

## 2024-11-16 MED ORDER — METHYLPREDNISOLONE 4 MG PO TBPK
ORAL_TABLET | ORAL | 0 refills | Status: AC
  Filled 2024-11-16: qty 21, 6d supply, fill #0

## 2024-11-16 MED ORDER — MELOXICAM 15 MG PO TABS
15.0000 mg | ORAL_TABLET | Freq: Every day | ORAL | 0 refills | Status: AC
  Filled 2024-11-16: qty 30, 30d supply, fill #0

## 2024-11-27 ENCOUNTER — Ambulatory Visit
Admission: RE | Admit: 2024-11-27 | Discharge: 2024-11-27 | Disposition: A | Source: Ambulatory Visit | Attending: Family Medicine | Admitting: Family Medicine

## 2024-11-27 DIAGNOSIS — Z1231 Encounter for screening mammogram for malignant neoplasm of breast: Secondary | ICD-10-CM | POA: Insufficient documentation

## 2024-12-14 ENCOUNTER — Other Ambulatory Visit: Payer: Self-pay

## 2024-12-14 MED ORDER — PROPRANOLOL HCL ER 60 MG PO CP24
60.0000 mg | ORAL_CAPSULE | Freq: Every day | ORAL | 0 refills | Status: AC
  Filled 2024-12-14: qty 30, 30d supply, fill #0

## 2024-12-14 MED ORDER — ONDANSETRON 4 MG PO TBDP
4.0000 mg | ORAL_TABLET | Freq: Three times a day (TID) | ORAL | 0 refills | Status: AC | PRN
  Filled 2024-12-14: qty 20, 7d supply, fill #0
